# Patient Record
Sex: Male | Born: 1975 | Race: Black or African American | Hispanic: No | Marital: Married | Smoking: Never smoker
Health system: Southern US, Community
[De-identification: ages and names within clinical notes are randomized; demographics above are authoritative.]

## PROBLEM LIST (undated history)

## (undated) DIAGNOSIS — D1803 Hemangioma of intra-abdominal structures: Secondary | ICD-10-CM

## (undated) DIAGNOSIS — E119 Type 2 diabetes mellitus without complications: Secondary | ICD-10-CM

## (undated) HISTORY — DX: Hemangioma of intra-abdominal structures: D18.03

---

## 2015-10-31 ENCOUNTER — Ambulatory Visit: Payer: Self-pay | Attending: Internal Medicine

## 2015-12-29 ENCOUNTER — Ambulatory Visit: Payer: Self-pay | Admitting: Family Medicine

## 2016-06-07 ENCOUNTER — Ambulatory Visit (INDEPENDENT_AMBULATORY_CARE_PROVIDER_SITE_OTHER): Payer: BLUE CROSS/BLUE SHIELD | Admitting: Physician Assistant

## 2016-06-07 ENCOUNTER — Encounter (INDEPENDENT_AMBULATORY_CARE_PROVIDER_SITE_OTHER): Payer: Self-pay | Admitting: Physician Assistant

## 2016-06-07 VITALS — BP 151/105 | HR 61 | Temp 97.8°F | Ht 71.0 in | Wt 172.0 lb

## 2016-06-07 DIAGNOSIS — E119 Type 2 diabetes mellitus without complications: Secondary | ICD-10-CM | POA: Insufficient documentation

## 2016-06-07 DIAGNOSIS — R5383 Other fatigue: Secondary | ICD-10-CM | POA: Diagnosis not present

## 2016-06-07 DIAGNOSIS — R631 Polydipsia: Secondary | ICD-10-CM | POA: Diagnosis not present

## 2016-06-07 DIAGNOSIS — N390 Urinary tract infection, site not specified: Secondary | ICD-10-CM

## 2016-06-07 LAB — POCT URINALYSIS DIPSTICK
Bilirubin, UA: NEGATIVE
Blood, UA: NEGATIVE
Glucose, UA: 500
Leukocytes, UA: NEGATIVE
Nitrite, UA: NEGATIVE
PH UA: 6
PROTEIN UA: NEGATIVE
SPEC GRAV UA: 1.025
UROBILINOGEN UA: 0.2

## 2016-06-07 LAB — POCT GLYCOSYLATED HEMOGLOBIN (HGB A1C): HEMOGLOBIN A1C: 9.8

## 2016-06-07 MED ORDER — METFORMIN HCL ER 500 MG PO TB24: 500.0000 mg | ORAL_TABLET | Freq: Every day | ORAL | 5 refills | Status: DC

## 2016-06-07 NOTE — Patient Instructions (Signed)
Diabetes Mellitus and Food It is important for you to manage your blood sugar (glucose) level. Your blood glucose level can be greatly affected by what you eat. Eating healthier foods in the appropriate amounts throughout the day at about the same time each day will help you control your blood glucose level. It can also help slow or prevent worsening of your diabetes mellitus. Healthy eating may even help you improve the level of your blood pressure and reach or maintain a healthy weight. General recommendations for healthful eating and cooking habits include:  Eating meals and snacks regularly. Avoid going long periods of time without eating to lose weight.  Eating a diet that consists mainly of plant-based foods, such as fruits, vegetables, nuts, legumes, and whole grains.  Using low-heat cooking methods, such as baking, instead of high-heat cooking methods, such as deep frying.  Work with your dietitian to make sure you understand how to use the Nutrition Facts information on food labels. How can food affect me? Carbohydrates Carbohydrates affect your blood glucose level more than any other type of food. Your dietitian will help you determine how many carbohydrates to eat at each meal and teach you how to count carbohydrates. Counting carbohydrates is important to keep your blood glucose at a healthy level, especially if you are using insulin or taking certain medicines for diabetes mellitus. Alcohol Alcohol can cause sudden decreases in blood glucose (hypoglycemia), especially if you use insulin or take certain medicines for diabetes mellitus. Hypoglycemia can be a life-threatening condition. Symptoms of hypoglycemia (sleepiness, dizziness, and disorientation) are similar to symptoms of having too much alcohol. If your health care provider has given you approval to drink alcohol, do so in moderation and use the following guidelines:  Women should not have more than one drink per day, and men  should not have more than two drinks per day. One drink is equal to: ? 12 oz of beer. ? 5 oz of wine. ? 1 oz of hard liquor.  Do not drink on an empty stomach.  Keep yourself hydrated. Have water, diet soda, or unsweetened iced tea.  Regular soda, juice, and other mixers might contain a lot of carbohydrates and should be counted.  What foods are not recommended? As you make food choices, it is important to remember that all foods are not the same. Some foods have fewer nutrients per serving than other foods, even though they might have the same number of calories or carbohydrates. It is difficult to get your body what it needs when you eat foods with fewer nutrients. Examples of foods that you should avoid that are high in calories and carbohydrates but low in nutrients include:  Trans fats (most processed foods list trans fats on the Nutrition Facts label).  Regular soda.  Juice.  Candy.  Sweets, such as cake, pie, doughnuts, and cookies.  Fried foods.  What foods can I eat? Eat nutrient-rich foods, which will nourish your body and keep you healthy. The food you should eat also will depend on several factors, including:  The calories you need.  The medicines you take.  Your weight.  Your blood glucose level.  Your blood pressure level.  Your cholesterol level.  You should eat a variety of foods, including:  Protein. ? Lean cuts of meat. ? Proteins low in saturated fats, such as fish, egg whites, and beans. Avoid processed meats.  Fruits and vegetables. ? Fruits and vegetables that may help control blood glucose levels, such as apples,   mangoes, and yams.  Dairy products. ? Choose fat-free or low-fat dairy products, such as milk, yogurt, and cheese.  Grains, bread, pasta, and rice. ? Choose whole grain products, such as multigrain bread, whole oats, and brown rice. These foods may help control blood pressure.  Fats. ? Foods containing healthful fats, such as  nuts, avocado, olive oil, canola oil, and fish.  Does everyone with diabetes mellitus have the same meal plan? Because every person with diabetes mellitus is different, there is not one meal plan that works for everyone. It is very important that you meet with a dietitian who will help you create a meal plan that is just right for you. This information is not intended to replace advice given to you by your health care provider. Make sure you discuss any questions you have with your health care provider. Document Released: 12/13/2004 Document Revised: 08/24/2015 Document Reviewed: 02/12/2013 Elsevier Interactive Patient Education  2017 [employer] Inc[employer]  

## 2016-06-07 NOTE — Progress Notes (Signed)
Subjective:  Patient ID: Johnny Johnson, male    DOB: [birth year]  Age: 41 y.o. MRN: [id number]  CC: Concern for diabetes HPI Johnny Johnson is a 41 y.o. male with no significant past medical history presents today for concern of diabetes. He has been experiencing mild polydipsia and some fatigue. Says his whole family has diabetes. Would like to have testing done. He used to be a Stage manager in [place] but most of his time is taken up at work here in the [place]. He is also concerned for a sharp left-sided chest pain that lasts momentarily and completely subsides without pain. Pain happens sporadically and without cause. No other associated symptoms with chest pain such as radiation, diaphoresis, dizziness or shortness of breath. Denies any other symptoms   ROS Review of Systems  Constitutional: Negative for chills, fever and malaise/fatigue.  Eyes: Negative for blurred vision.  Respiratory: Negative for shortness of breath.   Cardiovascular: Positive for chest pain (See history of present illness). Negative for palpitations.  Gastrointestinal: Negative for abdominal pain and nausea.  Genitourinary: Negative for dysuria and hematuria.  Musculoskeletal: Negative for joint pain and myalgias.  Skin: Negative for rash.  Neurological: Negative for tingling and headaches.  Endo/Heme/Allergies: Positive for polydipsia (Sometimes).  Psychiatric/Behavioral: Negative for depression. The patient is not nervous/anxious.     Objective:  BP (!) 151/105 (BP Location: Left Arm, Patient Position: Sitting, Cuff Size: Normal)   Pulse 61   Temp 97.8 F (36.6 C) (Oral)   Ht 5\' 11"  (1.803 m)   Wt 172 lb (78 kg)   SpO2 100%   BMI 23.99 kg/m   BP/Weight 05/08/7822  Systolic BP 235  Diastolic BP 361  Wt. (Lbs) 172  BMI 23.99      Physical Exam  Constitutional: He is oriented to person, place, and time.  Tall, thin, NAD, polite  HENT:  Head: Normocephalic and atraumatic.   Eyes: No scleral icterus.  Neck: Normal range of motion. Neck supple. No thyromegaly present.  Cardiovascular: Normal rate, regular rhythm and normal heart sounds.   Pulmonary/Chest: Effort normal and breath sounds normal.  Abdominal: Soft. Bowel sounds are normal. There is no tenderness.  Musculoskeletal: He exhibits no edema.  Neurological: He is alert and oriented to person, place, and time.  Skin: Skin is warm and dry. No rash noted. No erythema. No pallor.  Psychiatric: He has a normal mood and affect. His behavior is normal. Thought content normal.  Vitals reviewed.    Assessment & Plan:   1. Type 2 diabetes mellitus without complication, without long-term current use of insulin (HCC) - metFORMIN (GLUCOPHAGE XR) 500 MG 24 hr tablet; Take 1 tablet (500 mg total) by mouth daily with breakfast.  Dispense: 30 tablet; Refill: 5 - Hemoglobin A1c 9.8 clinic today  2. Polydipsia - HgB A1c 9.8 - Comprehensive metabolic panel - CBC with Differential - Urinalysis Dipstick  3. Fatigue, unspecified type - HgB A1c 9.8 - Comprehensive metabolic panel - CBC with Differential - Urinalysis Dipstick   Meds ordered this encounter  Medications  . metFORMIN (GLUCOPHAGE XR) 500 MG 24 hr tablet    Sig: Take 1 tablet (500 mg total) by mouth daily with breakfast.    Dispense:  30 tablet    Refill:  5    Order Specific Question:   Supervising Provider    Answer:   Tresa Garter [[id number]]    Follow-up: Return in about 3 months (around 09/07/2016).   Lesli Albee  Altamease Oiler PA

## 2016-06-08 LAB — CBC WITH DIFFERENTIAL/PLATELET
BASOS ABS: 0 10*3/uL (ref 0.0–0.2)
BASOS: 1 %
EOS (ABSOLUTE): 0.1 10*3/uL (ref 0.0–0.4)
Eos: 2 %
HEMOGLOBIN: 15.4 g/dL (ref 13.0–17.7)
Hematocrit: 45.5 % (ref 37.5–51.0)
IMMATURE GRANS (ABS): 0 10*3/uL (ref 0.0–0.1)
Immature Granulocytes: 0 %
LYMPHS ABS: 2.3 10*3/uL (ref 0.7–3.1)
Lymphs: 52 %
MCH: 30.6 pg (ref 26.6–33.0)
MCHC: 33.8 g/dL (ref 31.5–35.7)
MCV: 90 fL (ref 79–97)
MONOCYTES: 7 %
Monocytes Absolute: 0.3 10*3/uL (ref 0.1–0.9)
NEUTROS ABS: 1.7 10*3/uL (ref 1.4–7.0)
Neutrophils: 38 %
Platelets: 276 10*3/uL (ref 150–379)
RBC: 5.04 x10E6/uL (ref 4.14–5.80)
RDW: 13.1 % (ref 12.3–15.4)
WBC: 4.4 10*3/uL (ref 3.4–10.8)

## 2016-06-08 LAB — COMPREHENSIVE METABOLIC PANEL
A/G RATIO: 1.8 (ref 1.2–2.2)
ALT: 25 IU/L (ref 0–44)
AST: 14 IU/L (ref 0–40)
Albumin: 4.5 g/dL (ref 3.5–5.5)
Alkaline Phosphatase: 159 IU/L — ABNORMAL HIGH (ref 39–117)
BILIRUBIN TOTAL: 0.4 mg/dL (ref 0.0–1.2)
BUN / CREAT RATIO: 17 (ref 9–20)
BUN: 15 mg/dL (ref 6–20)
CALCIUM: 10.1 mg/dL (ref 8.7–10.2)
CHLORIDE: 98 mmol/L (ref 96–106)
CO2: 27 mmol/L (ref 18–29)
Creatinine, Ser: 0.87 mg/dL (ref 0.76–1.27)
GFR, EST AFRICAN AMERICAN: 126 mL/min/{1.73_m2} (ref >59)
GFR, EST NON AFRICAN AMERICAN: 109 mL/min/{1.73_m2} (ref >59)
Globulin, Total: 2.5 g/dL (ref 1.5–4.5)
Glucose: 235 mg/dL — ABNORMAL HIGH (ref 65–99)
POTASSIUM: 4.3 mmol/L (ref 3.5–5.2)
Sodium: 139 mmol/L (ref 134–144)
TOTAL PROTEIN: 7 g/dL (ref 6.0–8.5)

## 2016-06-13 ENCOUNTER — Encounter (INDEPENDENT_AMBULATORY_CARE_PROVIDER_SITE_OTHER): Payer: Self-pay

## 2016-06-24 ENCOUNTER — Ambulatory Visit (INDEPENDENT_AMBULATORY_CARE_PROVIDER_SITE_OTHER): Payer: BLUE CROSS/BLUE SHIELD | Admitting: Physician Assistant

## 2016-06-24 ENCOUNTER — Encounter (INDEPENDENT_AMBULATORY_CARE_PROVIDER_SITE_OTHER): Payer: Self-pay | Admitting: Physician Assistant

## 2016-06-24 DIAGNOSIS — R748 Abnormal levels of other serum enzymes: Secondary | ICD-10-CM | POA: Diagnosis not present

## 2016-06-24 NOTE — Progress Notes (Signed)
   Subjective:  Patient ID: Johnny Johnson, male    DOB: 1975-05-30  Age: 41 y.o. MRN: [id number]  CC:  Discuss labs   HPI Johnny Johnson is a 41 y.o. male with medical hx of DM2  presents to discuss elevation of ALP in his last lab test. He is asymptomatic. Received Hep B series in his previous employment in his country. He is type O blood.   Outpatient Medications Prior to Visit  Medication Sig Dispense Refill  . metFORMIN (GLUCOPHAGE XR) 500 MG 24 hr tablet Take 1 tablet (500 mg total) by mouth daily with breakfast. 30 tablet 5   No facility-administered medications prior to visit.      ROS Review of Systems  Constitutional: Negative for chills, fever and malaise/fatigue.  Eyes: Negative for blurred vision.  Respiratory: Negative for shortness of breath.   Cardiovascular: Negative for chest pain and palpitations.  Gastrointestinal: Negative for abdominal pain and nausea.  Genitourinary: Negative for dysuria and hematuria.  Musculoskeletal: Negative for joint pain and myalgias.  Skin: Negative for rash.  Neurological: Negative for tingling and headaches.  Psychiatric/Behavioral: Negative for depression. The patient is not nervous/anxious.     Objective:  There were no vitals taken for this visit.  BP/Weight 5/0/5697  Systolic BP 948  Diastolic BP 016  Wt. (Lbs) 172  BMI 23.99      Physical Exam  Constitutional: He is oriented to person, place, and time.  Well developed, thin, NAD, polite  HENT:  Head: Normocephalic and atraumatic.  Eyes: No scleral icterus.  Neck: Normal range of motion. Neck supple. No thyromegaly present.  Cardiovascular: Normal rate, regular rhythm and normal heart sounds.   Pulmonary/Chest: Effort normal and breath sounds normal.  Abdominal: Soft. Bowel sounds are normal. There is no tenderness.  Musculoskeletal: He exhibits no edema.  Neurological: He is alert and oriented to person, place, and time. No cranial nerve deficit. Coordination  normal.  Skin: Skin is warm and dry. No rash noted. No erythema. No pallor.  Psychiatric: He has a normal mood and affect. His behavior is normal. Thought content normal.  Vitals reviewed.    Assessment & Plan:   1. Elevated serum alkaline phosphatase level - Gamma GT - Comprehensive metabolic panel - Consider transient elevation of ALP due to type O blood. - Will call with results tomorrow.  Follow-up: Return if symptoms worsen or fail to improve.   Clent Demark PA

## 2016-06-25 ENCOUNTER — Telehealth (INDEPENDENT_AMBULATORY_CARE_PROVIDER_SITE_OTHER): Payer: Self-pay | Admitting: Physician Assistant

## 2016-06-25 LAB — COMPREHENSIVE METABOLIC PANEL
A/G RATIO: 1.6 (ref 1.2–2.2)
ALK PHOS: 150 IU/L — AB (ref 39–117)
ALT: 29 IU/L (ref 0–44)
AST: 15 IU/L (ref 0–40)
Albumin: 4.7 g/dL (ref 3.5–5.5)
BUN / CREAT RATIO: 18 (ref 9–20)
BUN: 16 mg/dL (ref 6–20)
Bilirubin Total: 0.4 mg/dL (ref 0.0–1.2)
CO2: 24 mmol/L (ref 18–29)
Calcium: 9.8 mg/dL (ref 8.7–10.2)
Chloride: 101 mmol/L (ref 96–106)
Creatinine, Ser: 0.9 mg/dL (ref 0.76–1.27)
GFR calc Af Amer: 124 mL/min/{1.73_m2} (ref >59)
GFR calc non Af Amer: 107 mL/min/{1.73_m2} (ref >59)
GLOBULIN, TOTAL: 2.9 g/dL (ref 1.5–4.5)
Glucose: 200 mg/dL — ABNORMAL HIGH (ref 65–99)
POTASSIUM: 4.2 mmol/L (ref 3.5–5.2)
SODIUM: 144 mmol/L (ref 134–144)
Total Protein: 7.6 g/dL (ref 6.0–8.5)

## 2016-06-25 LAB — GAMMA GT: GGT: 45 IU/L (ref 0–65)

## 2016-06-25 NOTE — Telephone Encounter (Signed)
Patient called to request lab results Please follow up with patient

## 2016-06-26 ENCOUNTER — Telehealth (INDEPENDENT_AMBULATORY_CARE_PROVIDER_SITE_OTHER): Payer: Self-pay | Admitting: Physician Assistant

## 2016-06-26 NOTE — Telephone Encounter (Signed)
Patient returned call from PCP, please call patient at earliest convenience. Nat Christen, CMA

## 2016-06-26 NOTE — Telephone Encounter (Signed)
Will call patient when lab results are available. Nat Christen, CMA

## 2016-06-26 NOTE — Telephone Encounter (Signed)
Patient called back stated Johnny Johnson, asked him to call back.  Please follow up with patient.

## 2016-06-27 NOTE — Telephone Encounter (Signed)
I spoke to Mr. Johnny Johnson and discussed his GGT result. I advised that he return for a few more tests and to come fasting the next time he returns.

## 2016-07-01 ENCOUNTER — Ambulatory Visit (INDEPENDENT_AMBULATORY_CARE_PROVIDER_SITE_OTHER): Payer: BLUE CROSS/BLUE SHIELD | Admitting: Physician Assistant

## 2017-12-01 ENCOUNTER — Encounter (HOSPITAL_COMMUNITY): Payer: Self-pay

## 2017-12-01 ENCOUNTER — Emergency Department (HOSPITAL_COMMUNITY)
Admission: EM | Admit: 2017-12-01 | Discharge: 2017-12-01 | Disposition: A | Discharge status: Home / Self Care | Payer: BLUE CROSS/BLUE SHIELD | Attending: Emergency Medicine | Admitting: Emergency Medicine

## 2017-12-01 DIAGNOSIS — Z7984 Long term (current) use of oral hypoglycemic drugs: Secondary | ICD-10-CM | POA: Insufficient documentation

## 2017-12-01 DIAGNOSIS — Z79899 Other long term (current) drug therapy: Secondary | ICD-10-CM | POA: Insufficient documentation

## 2017-12-01 DIAGNOSIS — E119 Type 2 diabetes mellitus without complications: Secondary | ICD-10-CM | POA: Insufficient documentation

## 2017-12-01 DIAGNOSIS — R1010 Upper abdominal pain, unspecified: Secondary | ICD-10-CM | POA: Insufficient documentation

## 2017-12-01 HISTORY — DX: Type 2 diabetes mellitus without complications: E11.9

## 2017-12-01 LAB — URINALYSIS, ROUTINE W REFLEX MICROSCOPIC
BILIRUBIN URINE: NEGATIVE
Glucose, UA: NEGATIVE mg/dL
Hgb urine dipstick: NEGATIVE
Ketones, ur: 5 mg/dL — AB
Leukocytes, UA: NEGATIVE
Nitrite: NEGATIVE
PROTEIN: 30 mg/dL — AB
Specific Gravity, Urine: 1.036 — ABNORMAL HIGH (ref 1.005–1.030)
pH: 5 (ref 5.0–8.0)

## 2017-12-01 LAB — CBC
HCT: 44.6 % (ref 39.0–52.0)
Hemoglobin: 14.5 g/dL (ref 13.0–17.0)
MCH: 30 pg (ref 26.0–34.0)
MCHC: 32.5 g/dL (ref 30.0–36.0)
MCV: 92.3 fL (ref 78.0–100.0)
PLATELETS: 285 10*3/uL (ref 150–400)
RBC: 4.83 MIL/uL (ref 4.22–5.81)
RDW: 12.2 % (ref 11.5–15.5)
WBC: 4.9 10*3/uL (ref 4.0–10.5)

## 2017-12-01 LAB — COMPREHENSIVE METABOLIC PANEL
ALK PHOS: 82 U/L (ref 38–126)
ALT: 24 U/L (ref 0–44)
AST: 19 U/L (ref 15–41)
Albumin: 3.9 g/dL (ref 3.5–5.0)
Anion gap: 10 (ref 5–15)
BILIRUBIN TOTAL: 0.6 mg/dL (ref 0.3–1.2)
BUN: 14 mg/dL (ref 6–20)
CALCIUM: 9.3 mg/dL (ref 8.9–10.3)
CO2: 22 mmol/L (ref 22–32)
CREATININE: 0.99 mg/dL (ref 0.61–1.24)
Chloride: 107 mmol/L (ref 98–111)
Glucose, Bld: 141 mg/dL — ABNORMAL HIGH (ref 70–99)
Potassium: 3.7 mmol/L (ref 3.5–5.1)
Sodium: 139 mmol/L (ref 135–145)
Total Protein: 6.7 g/dL (ref 6.5–8.1)

## 2017-12-01 LAB — LIPASE, BLOOD: Lipase: 51 U/L (ref 11–51)

## 2017-12-01 LAB — CBG MONITORING, ED: GLUCOSE-CAPILLARY: 112 mg/dL — AB (ref 70–99)

## 2017-12-01 MED ORDER — SODIUM CHLORIDE 0.9 % IV BOLUS
1000.0000 mL | Freq: Once | INTRAVENOUS | Status: AC
  Administered 2017-12-01: 1000 mL via INTRAVENOUS

## 2017-12-01 MED ORDER — RANITIDINE HCL 150 MG PO TABS: 150.0000 mg | ORAL_TABLET | Freq: Two times a day (BID) | ORAL | 0 refills | Status: DC

## 2017-12-01 NOTE — ED Provider Notes (Signed)
[facility] EMERGENCY DEPARTMENT Provider Note   CSN: [id number] Arrival date & time: 12/01/17  2637     History   Chief Complaint Chief Complaint  Patient presents with  . Abdominal Pain    HPI Johnny Johnson is a 42 y.o. male with history of diabetes mellitus presents today for evaluation of acute onset, intermittent upper abdominal pain for 6 weeks.  He notes crampy burning pain to the upper abdomen which occasionally worsens after meals.  Denies any pain at this time.  Also endorses feelings of abdominal bloating and frequent belching.  He denies nausea, vomiting, fevers, chills, chest pain, or shortness of breath.  He believes he has lost around 8 pounds in the past month or so.  He also notes that he has had a long-standing history with intermittent loose stools and constipation.  He went to see his PCP who prescribed dicyclomine which he has been taking as needed without relief of his symptoms.  Denies urinary symptoms.  He is diabetic and states his blood sugars run between 90-170.  The history is provided by the patient.    Past Medical History:  Diagnosis Date  . Diabetes mellitus without complication [facility])     Patient Active Problem List   Diagnosis Date Noted  . DM (diabetes mellitus), type 2 ([place]) 06/07/2016    History reviewed. No pertinent surgical history.      Home Medications    Prior to Admission medications   Medication Sig Start Date End Date Taking? Authorizing Provider  atorvastatin (LIPITOR) 20 MG tablet Take 20 mg by mouth daily. 09/24/17  Yes [provider]  GLIPIZIDE XL 5 MG 24 hr tablet Take 5 mg by mouth daily. 09/07/17  Yes [provider]  metFORMIN (GLUCOPHAGE XR) 500 MG 24 hr tablet Take 1 tablet (500 mg total) by mouth daily with breakfast. Patient taking differently: Take 1,000 mg by mouth 2 (two) times daily.  06/07/16  Yes Clent Demark, PA-C  ranitidine (ZANTAC) 150 MG tablet Take 1 tablet  (150 mg total) by mouth 2 (two) times daily. 12/01/17   Renita Papa, PA-C    Family History No family history on file.  Social History Social History   Tobacco Use  . Smoking status: Never Smoker  . Smokeless tobacco: Never Used  Substance Use Topics  . Alcohol use: Not on file  . Drug use: Not on file     Allergies   Patient has no known allergies.   Review of Systems Review of Systems  Constitutional: Negative for chills and fever.  Respiratory: Negative for shortness of breath.   Cardiovascular: Negative for chest pain.  Gastrointestinal: Positive for abdominal pain, constipation and diarrhea. Negative for blood in stool, nausea and vomiting.  Genitourinary: Negative for dysuria, hematuria and urgency.  All other systems reviewed and are negative.    Physical Exam Updated Vital Signs BP 124/82   Pulse 75   Temp 98.1 F (36.7 C) (Oral)   Resp 16   SpO2 100%   Physical Exam  Constitutional: He appears well-developed and well-nourished. No distress.  HENT:  Head: Normocephalic and atraumatic.  Eyes: Conjunctivae are normal. Right eye exhibits no discharge. Left eye exhibits no discharge.  Neck: No JVD present. No tracheal deviation present.  Cardiovascular: Normal rate, regular rhythm and normal heart sounds. Exam reveals no gallop and no friction rub.  No murmur heard. Pulmonary/Chest: Effort normal and breath sounds normal. No stridor. No respiratory distress.  He has no wheezes. He has no rales.  Abdominal: Soft. Bowel sounds are normal. He exhibits no distension. There is no tenderness. There is no rigidity, no rebound, no guarding, no CVA tenderness, no tenderness at McBurney's point and negative Murphy's sign.  Genitourinary:  Genitourinary Comments: Deferred   Musculoskeletal: He exhibits no edema.  Lymphadenopathy:  No midline spine TTP, no paraspinal muscle tenderness, no deformity, crepitus, or step-off noted   Neurological: He is alert.  Skin:  Skin is warm and dry. No erythema.  Psychiatric: He has a normal mood and affect. His behavior is normal.  Nursing note and vitals reviewed.    ED Treatments / Results  Labs (all labs ordered are listed, but only abnormal results are displayed) Labs Reviewed  COMPREHENSIVE METABOLIC PANEL - Abnormal; Notable for the following components:      Result Value   Glucose, Bld 141 (*)    All other components within normal limits  URINALYSIS, ROUTINE W REFLEX MICROSCOPIC - Abnormal; Notable for the following components:   APPearance HAZY (*)    Specific Gravity, Urine 1.036 (*)    Ketones, ur 5 (*)    Protein, ur 30 (*)    Bacteria, UA RARE (*)    All other components within normal limits  CBG MONITORING, ED - Abnormal; Notable for the following components:   Glucose-Capillary 112 (*)    All other components within normal limits  LIPASE, BLOOD  CBC    EKG None  Radiology No results found.  Procedures Procedures (including critical care time)  Medications Ordered in ED Medications  sodium chloride 0.9 % bolus 1,000 mL (0 mLs Intravenous Stopped 12/01/17 1028)     Initial Impression / Assessment and Plan / ED Course  I have reviewed the triage vital signs and the nursing notes.  Pertinent labs & imaging results that were available during my care of the patient were reviewed by me and considered in my medical decision making (see chart for details).     Patient with intermittent upper abdominal pain for several weeks.  Went to see his PCP who prescribed him dicyclomine and has not had significant relief.  Associated symptoms include nausea, feeling bloated, belching.  Symptoms sometimes worsen after meals.  Patient is afebrile, vital signs are stable.  He is nontoxic in appearance.  Abdomen is soft, no peritoneal signs on examination.  Entirely nontender on my examination and he notes he is not currently expensing any abdominal pain.  History and physical examination suggestive  of GERD versus PUD versus gastritis.  Lab work reviewed by me shows no leukocytosis, no anemia, no metabolic derangements.  LFTs, lipase, and creatinine within normal limits.  UA does not suggest UTI or nephrolithiasis, though does suggest dehydration with increased specific gravity and mild ketonuria and proteinuria.  We will give IV fluids in the ED.  Doubt obstruction, perforation, appendicitis, colitis, dissection, AAA, or other acute surgical abdominal pathology.  Patient well-appearing, tolerating p.o. fluids in the ED without difficulty.  He is currently not experiencing any symptoms and declines any pharmacologic therapy in the ED.  Will discharge with course of Zantac, discussed dietary modifications and other lifestyle modifications that may help alleviate his symptoms.  Recommend follow-up with PCP or gastroenterology for reevaluation of symptoms.  Discussed strict ED return precautions. Pt verbalized understanding of and agreement with plan and is safe for discharge home at this time.  Final Clinical Impressions(s) / ED Diagnoses   Final diagnoses:  Pain of upper abdomen  ED Discharge Orders         Ordered    ranitidine (ZANTAC) 150 MG tablet  2 times daily     12/01/17 [address], PA-C 12/03/17 1831    Gareth Morgan, MD 12/05/17 [phone]

## 2017-12-01 NOTE — ED Triage Notes (Addendum)
Patient complains of 1 week of intermittent burning to abdomen with cramping. No nausea, no vomiting. Intermittent diarrhea. States that he thinks he is losing weight as well. Alert and oriented, NAD

## 2017-12-01 NOTE — Discharge Instructions (Signed)
1. Medications: Start taking Zantac as prescribed twice daily for the next 2 to 4 weeks. 2. Treatment: rest, drink plenty of fluids.  Eat a diet of bland foods that will not upset your stomach.  Avoid fried foods, fatty foods, acidic foods, alcohol, or smoking.  I have attached information of foods that you can find helpful.  You may also find it helpful to elevate the head of the bed. 3. Follow Up: Please followup with your primary doctor in 3-7 days for discussion of your diagnoses and further evaluation after today's visit; you can also follow-up with gastroenterology; Please return to the ER for persistent vomiting, high fevers or worsening symptoms

## 2017-12-12 ENCOUNTER — Encounter: Payer: Self-pay | Admitting: Gastroenterology

## 2017-12-16 ENCOUNTER — Other Ambulatory Visit: Payer: Self-pay | Admitting: Gastroenterology

## 2017-12-16 DIAGNOSIS — R1011 Right upper quadrant pain: Secondary | ICD-10-CM

## 2017-12-21 ENCOUNTER — Encounter (HOSPITAL_COMMUNITY): Payer: Self-pay | Admitting: Emergency Medicine

## 2017-12-21 ENCOUNTER — Other Ambulatory Visit: Payer: Self-pay

## 2017-12-21 ENCOUNTER — Emergency Department (HOSPITAL_COMMUNITY)
Admission: EM | Admit: 2017-12-21 | Discharge: 2017-12-21 | Disposition: A | Discharge status: Home / Self Care | Payer: BLUE CROSS/BLUE SHIELD | Attending: Emergency Medicine | Admitting: Emergency Medicine

## 2017-12-21 DIAGNOSIS — Z79899 Other long term (current) drug therapy: Secondary | ICD-10-CM | POA: Diagnosis not present

## 2017-12-21 DIAGNOSIS — E119 Type 2 diabetes mellitus without complications: Secondary | ICD-10-CM | POA: Diagnosis not present

## 2017-12-21 DIAGNOSIS — Z7984 Long term (current) use of oral hypoglycemic drugs: Secondary | ICD-10-CM | POA: Insufficient documentation

## 2017-12-21 DIAGNOSIS — R197 Diarrhea, unspecified: Secondary | ICD-10-CM

## 2017-12-21 LAB — COMPREHENSIVE METABOLIC PANEL
ALK PHOS: 89 U/L (ref 38–126)
ALT: 22 U/L (ref 0–44)
ANION GAP: 10 (ref 5–15)
AST: 18 U/L (ref 15–41)
Albumin: 3.9 g/dL (ref 3.5–5.0)
BUN: 11 mg/dL (ref 6–20)
CO2: 25 mmol/L (ref 22–32)
Calcium: 9.5 mg/dL (ref 8.9–10.3)
Chloride: 103 mmol/L (ref 98–111)
Creatinine, Ser: 1.04 mg/dL (ref 0.61–1.24)
Glucose, Bld: 190 mg/dL — ABNORMAL HIGH (ref 70–99)
Potassium: 3.9 mmol/L (ref 3.5–5.1)
SODIUM: 138 mmol/L (ref 135–145)
Total Bilirubin: 0.6 mg/dL (ref 0.3–1.2)
Total Protein: 6.7 g/dL (ref 6.5–8.1)

## 2017-12-21 LAB — URINALYSIS, ROUTINE W REFLEX MICROSCOPIC
Bilirubin Urine: NEGATIVE
Glucose, UA: NEGATIVE mg/dL
Hgb urine dipstick: NEGATIVE
Ketones, ur: NEGATIVE mg/dL
LEUKOCYTES UA: NEGATIVE
NITRITE: NEGATIVE
PROTEIN: NEGATIVE mg/dL
SPECIFIC GRAVITY, URINE: 1.02 (ref 1.005–1.030)
pH: 5 (ref 5.0–8.0)

## 2017-12-21 LAB — CBC
HCT: 44.3 % (ref 39.0–52.0)
HEMOGLOBIN: 14.8 g/dL (ref 13.0–17.0)
MCH: 30.1 pg (ref 26.0–34.0)
MCHC: 33.4 g/dL (ref 30.0–36.0)
MCV: 90 fL (ref 78.0–100.0)
PLATELETS: 336 10*3/uL (ref 150–400)
RBC: 4.92 MIL/uL (ref 4.22–5.81)
RDW: 12.1 % (ref 11.5–15.5)
WBC: 4.5 10*3/uL (ref 4.0–10.5)

## 2017-12-21 LAB — LIPASE, BLOOD: LIPASE: 40 U/L (ref 11–51)

## 2017-12-21 LAB — POC OCCULT BLOOD, ED: FECAL OCCULT BLD: NEGATIVE

## 2017-12-21 MED ORDER — SUCRALFATE 1 G PO TABS
1.0000 g | ORAL_TABLET | Freq: Once | ORAL | Status: AC
  Administered 2017-12-21: 1 g via ORAL
  Filled 2017-12-21: qty 1

## 2017-12-21 MED ORDER — GI COCKTAIL ~~LOC~~
30.0000 mL | Freq: Once | ORAL | Status: AC
  Administered 2017-12-21: 30 mL via ORAL
  Filled 2017-12-21: qty 30

## 2017-12-21 MED ORDER — FAMOTIDINE 20 MG PO TABS
20.0000 mg | ORAL_TABLET | Freq: Once | ORAL | Status: AC
  Administered 2017-12-21: 20 mg via ORAL
  Filled 2017-12-21: qty 1

## 2017-12-21 NOTE — Discharge Instructions (Signed)
You will need to follow-up with your regular doctor and your gastroenterologist in regards to your symptoms.  Please return to the ER sooner if you have any new or worsening symptoms, or if you have any of the following symptoms:  Abdominal pain that does not go away.  You have a fever.  You keep throwing up (vomiting).  The pain is felt only in portions of the abdomen. Pain in the right side could possibly be appendicitis. In an adult, pain in the left lower portion of the abdomen could be colitis or diverticulitis.  You pass bloody or black tarry stools.  There is bright red blood in the stool.  The constipation stays for more than 4 days.  There is belly (abdominal) or rectal pain.  You do not seem to be getting better.  You have any questions or concerns.

## 2017-12-21 NOTE — ED Triage Notes (Signed)
Pt states he was seen in ED 3 weeks ago for intermittent burning and cramping to abd.  States pain has improved some but not completely relieved.  Also reports black diarrhea.  Denies nausea and vomiting.

## 2017-12-21 NOTE — ED Notes (Signed)
ED Provider at bedside. 

## 2017-12-21 NOTE — ED Provider Notes (Signed)
[facility] [place] EMERGENCY DEPARTMENT Provider Note   CSN: [id number] Arrival date & time: 12/21/17  1743     History   Chief Complaint Chief Complaint  Patient presents with  . Abdominal Pain    HPI Johnny Johnson is a 42 y.o. male.  HPI   Pt is a 42 year old male with history of T2DM who presents emergency department today complaining of persistent abdominal pain and black diarrhea.  States he has had the abdominal pain for several weeks and he was seen in the ED recently for this.  He was prescribed dicyclomine for his symptoms and since then it has improved somewhat.  Pain is intermittent and occurs about 3 times per day.  Rates pain as mild and 3/10.  It does not radiate.  No nausea or vomiting.  He presented today to the ED due to concern for diarrhea for the last 2 days.  He states that he has had black diarrhea.  No lightheadedness, dizziness, shortness of breath or chest pain.  No urinary symptoms.  No fevers or chills.  States he has been taking Maalox as well.  Past Medical History:  Diagnosis Date  . Diabetes mellitus without complication [facility])     Patient Active Problem List   Diagnosis Date Noted  . DM (diabetes mellitus), type 2 ([place]) 06/07/2016    History reviewed. No pertinent surgical history.      Home Medications    Prior to Admission medications   Medication Sig Start Date End Date Taking? Authorizing Provider  atorvastatin (LIPITOR) 20 MG tablet Take 20 mg by mouth daily. 09/24/17   [provider]  GLIPIZIDE XL 5 MG 24 hr tablet Take 5 mg by mouth daily. 09/07/17   [provider]  metFORMIN (GLUCOPHAGE XR) 500 MG 24 hr tablet Take 1 tablet (500 mg total) by mouth daily with breakfast. Patient taking differently: Take 1,000 mg by mouth 2 (two) times daily.  06/07/16   Clent Demark, PA-C  ranitidine (ZANTAC) 150 MG tablet Take 1 tablet (150 mg total) by mouth 2 (two) times daily. 12/01/17   Renita Papa, PA-C     Family History No family history on file.  Social History Social History   Tobacco Use  . Smoking status: Never Smoker  . Smokeless tobacco: Never Used  Substance Use Topics  . Alcohol use: Not Currently  . Drug use: Not Currently     Allergies   Patient has no known allergies.   Review of Systems Review of Systems  Constitutional: Negative for chills and fever.  HENT: Negative for congestion and rhinorrhea.   Eyes: Negative for visual disturbance.  Respiratory: Negative for cough and shortness of breath.   Cardiovascular: Negative for chest pain.  Gastrointestinal: Positive for abdominal pain and diarrhea. Negative for constipation, nausea and vomiting.       Black stool  Endocrine: Negative for polyuria.  Genitourinary: Negative for dysuria, frequency and urgency.  Skin: Negative for color change.  Neurological: Negative for headaches.   Physical Exam Updated Vital Signs BP (!) 125/96   Pulse 76   Temp 98.4 F (36.9 C) (Oral)   SpO2 100%   Physical Exam  Constitutional: He appears well-developed and well-nourished.  Non-toxic appearance. He does not appear ill. No distress.  HENT:  Head: Normocephalic and atraumatic.  Mouth/Throat: Oropharynx is clear and moist.  Eyes: Conjunctivae are normal. No scleral icterus.  Neck: Neck supple.  Cardiovascular: Normal rate, regular rhythm, normal heart  sounds and intact distal pulses.  No murmur heard. Pulmonary/Chest: Effort normal and breath sounds normal. No respiratory distress. He has no wheezes. He has no rales.  Abdominal: Soft. Bowel sounds are normal. There is no tenderness. There is no rigidity, no rebound, no guarding and no CVA tenderness.  Genitourinary:  Genitourinary Comments: Digital rectal exam completed with chaperone present in the room.  Patient has normal rectal tone.  No gross blood on exam.  No significant stool in the vault.  Musculoskeletal: He exhibits no edema.  Neurological: He is alert.    Skin: Skin is warm and dry.  Psychiatric: He has a normal mood and affect.  Nursing note and vitals reviewed.  ED Treatments / Results  Labs (all labs ordered are listed, but only abnormal results are displayed) Labs Reviewed  COMPREHENSIVE METABOLIC PANEL - Abnormal; Notable for the following components:      Result Value   Glucose, Bld 190 (*)    All other components within normal limits  LIPASE, BLOOD  CBC  URINALYSIS, ROUTINE W REFLEX MICROSCOPIC  POC OCCULT BLOOD, ED    EKG None  Radiology No results found.  Procedures Procedures (including critical care time)  Medications Ordered in ED Medications  gi cocktail (Maalox,Lidocaine,Donnatal) (30 mLs Oral Given 12/21/17 1846)  sucralfate (CARAFATE) tablet 1 g (1 g Oral Given 12/21/17 1845)  famotidine (PEPCID) tablet 20 mg (20 mg Oral Given 12/21/17 1845)     Initial Impression / Assessment and Plan / ED Course  I have reviewed the triage vital signs and the nursing notes.  Pertinent labs & imaging results that were available during my care of the patient were reviewed by me and considered in my medical decision making (see chart for details).     Final Clinical Impressions(s) / ED Diagnoses   Final diagnoses:  Diarrhea, unspecified type   Pt presenting with concerns of black diarrhea for 2 days. No lightheadedness dizziness or bright red blood per rectum.  Complains of abdominal pain that have been present for the last several weeks.  Symptoms of abdominal pain have improved since onset after being prescribed by dicyclomine.  He is currently being followed by GI and his PCP for his symptoms and is scheduled for outpatient imaging studies next week.  His vitals are stable in the ED today and his exam is benign.  He has no abdominal tenderness, rebound guarding or rigidity.  No CVA tenderness bilaterally.  Hemoccult was negative for blood.  Labs are reassuring without leukocytosis or anemia.  Normal electrolytes, kidney  and liver function.  Lipase negative.  UA negative.  Unclear etiology of patient's symptoms however lower suspicion for infectious diarrhea given no fevers or bloody stools and no recent history to support this.  Dark color of stool may be secondary to use of Maalox and diarrhea may be secondary to metformin use. his exam today is benign and he is nontoxic nonseptic appearing and feel that he has low risk for an acute intra-abdominal pathology at this time.  Feel he is safe to follow-up as an outpatient with his GI and PCP.  Advised him to call the office tomorrow to schedule appointment and to return to the ER for any new or worsening symptoms in the meantime.  Patient voiced understanding of plan reasons to return to the ED.  All questions answered.  ED Discharge Orders    None       Bishop Dublin 12/21/17 2149    Gerlene Fee  M, MD 12/21/17 2314

## 2017-12-21 NOTE — ED Notes (Signed)
Occult card at bedside 

## 2017-12-26 ENCOUNTER — Ambulatory Visit (HOSPITAL_COMMUNITY)
Admission: RE | Admit: 2017-12-26 | Discharge: 2017-12-26 | Disposition: A | Discharge status: Home / Self Care | Payer: BLUE CROSS/BLUE SHIELD | Source: Ambulatory Visit | Attending: Gastroenterology | Admitting: Gastroenterology

## 2017-12-26 DIAGNOSIS — R1011 Right upper quadrant pain: Secondary | ICD-10-CM | POA: Diagnosis present

## 2017-12-26 DIAGNOSIS — R160 Hepatomegaly, not elsewhere classified: Secondary | ICD-10-CM | POA: Diagnosis not present

## 2017-12-26 DIAGNOSIS — K760 Fatty (change of) liver, not elsewhere classified: Secondary | ICD-10-CM | POA: Insufficient documentation

## 2017-12-26 MED ORDER — TECHNETIUM TC 99M MEBROFENIN IV KIT
5.4000 | PACK | Freq: Once | INTRAVENOUS | Status: AC
  Administered 2017-12-26: 5.4 via INTRAVENOUS

## 2018-01-01 ENCOUNTER — Other Ambulatory Visit: Payer: Self-pay | Admitting: Gastroenterology

## 2018-01-01 DIAGNOSIS — R933 Abnormal findings on diagnostic imaging of other parts of digestive tract: Secondary | ICD-10-CM

## 2018-01-05 ENCOUNTER — Ambulatory Visit (HOSPITAL_COMMUNITY)
Admission: RE | Admit: 2018-01-05 | Discharge: 2018-01-05 | Disposition: A | Discharge status: Home / Self Care | Payer: BLUE CROSS/BLUE SHIELD | Source: Ambulatory Visit | Attending: Gastroenterology | Admitting: Gastroenterology

## 2018-01-05 DIAGNOSIS — D1803 Hemangioma of intra-abdominal structures: Secondary | ICD-10-CM | POA: Insufficient documentation

## 2018-01-05 DIAGNOSIS — R933 Abnormal findings on diagnostic imaging of other parts of digestive tract: Secondary | ICD-10-CM | POA: Diagnosis present

## 2018-01-05 MED ORDER — GADOBUTROL 1 MMOL/ML IV SOLN
7.5000 mL | Freq: Once | INTRAVENOUS | Status: AC | PRN
  Administered 2018-01-05: 7.5 mL via INTRAVENOUS

## 2018-01-20 ENCOUNTER — Ambulatory Visit: Payer: BLUE CROSS/BLUE SHIELD | Admitting: Gastroenterology

## 2018-02-17 ENCOUNTER — Ambulatory Visit: Payer: BLUE CROSS/BLUE SHIELD | Admitting: Gastroenterology

## 2018-04-30 ENCOUNTER — Other Ambulatory Visit: Payer: Self-pay

## 2018-04-30 ENCOUNTER — Encounter (INDEPENDENT_AMBULATORY_CARE_PROVIDER_SITE_OTHER): Payer: Self-pay | Admitting: Family Medicine

## 2018-04-30 ENCOUNTER — Ambulatory Visit (INDEPENDENT_AMBULATORY_CARE_PROVIDER_SITE_OTHER): Payer: BLUE CROSS/BLUE SHIELD | Admitting: Family Medicine

## 2018-04-30 VITALS — BP 117/81 | HR 81 | Temp 98.2°F | Ht 71.0 in | Wt 149.0 lb

## 2018-04-30 DIAGNOSIS — R21 Rash and other nonspecific skin eruption: Secondary | ICD-10-CM

## 2018-04-30 DIAGNOSIS — E119 Type 2 diabetes mellitus without complications: Secondary | ICD-10-CM | POA: Diagnosis not present

## 2018-04-30 LAB — POCT GLYCOSYLATED HEMOGLOBIN (HGB A1C): HEMOGLOBIN A1C: 5.6 % (ref 4.0–5.6)

## 2018-04-30 MED ORDER — TRIAMCINOLONE ACETONIDE 0.1 % EX CREA: 1.0000 "application " | TOPICAL_CREAM | Freq: Two times a day (BID) | CUTANEOUS | 1 refills | Status: AC

## 2018-04-30 MED ORDER — ATORVASTATIN CALCIUM 20 MG PO TABS: 20.0000 mg | ORAL_TABLET | Freq: Every day | ORAL | 6 refills | Status: DC

## 2018-04-30 MED ORDER — GLIPIZIDE 5 MG PO TABS: 2.5000 mg | ORAL_TABLET | Freq: Two times a day (BID) | ORAL | 6 refills | Status: AC

## 2018-04-30 NOTE — Patient Instructions (Signed)
Please obtain Hydrocortisone cream over-the-counter for rash on your face.

## 2018-04-30 NOTE — Progress Notes (Signed)
Subjective:  Patient ID: Johnny Johnson, male    DOB: 05-26-1975  Age: 43 y.o. MRN: [id number]  CC: Abdominal Pain   HPI Johnny Johnson is a 43 year old male with a history of type 2 diabetes mellitus (A1c 5.6) who presents today for a follow-up of his diabetes mellitus. He is up-to-date on annual eye exam which he had 1 month ago at [facility] eye care. He was previously on metformin but discontinued it due to abdominal pain and other GI side effects which have resolved ever since he discontinued metformin.  Remains on glipizide at this time.  He is concerned about a chronic rash on his forehead and back which he has had for over 6 months and rash is pruritic.  Denies allergies to foods or creams and denies introduction of new detergents or body products.  Past Medical History:  Diagnosis Date  . Diabetes mellitus without complication ([place])   . Hepatic hemangioma     History reviewed. No pertinent surgical history.  No Known Allergies   Outpatient Medications Prior to Visit  Medication Sig Dispense Refill  . atorvastatin (LIPITOR) 20 MG tablet Take 20 mg by mouth daily.  1  . GLIPIZIDE XL 5 MG 24 hr tablet Take 5 mg by mouth daily.  0  . metFORMIN (GLUCOPHAGE XR) 500 MG 24 hr tablet Take 1 tablet (500 mg total) by mouth daily with breakfast. (Patient not taking: Reported on 04/30/2018) 30 tablet 5  . ranitidine (ZANTAC) 150 MG tablet Take 1 tablet (150 mg total) by mouth 2 (two) times daily. 60 tablet 0   No facility-administered medications prior to visit.     ROS Review of Systems  Constitutional: Negative for activity change and appetite change.  HENT: Negative for sinus pressure and sore throat.   Eyes: Negative for visual disturbance.  Respiratory: Negative for cough, chest tightness and shortness of breath.   Cardiovascular: Negative for chest pain and leg swelling.  Gastrointestinal: Negative for abdominal distention, abdominal pain, constipation and  diarrhea.  Endocrine: Negative.   Genitourinary: Negative for dysuria.  Musculoskeletal: Negative for joint swelling and myalgias.  Skin: Negative for rash.  Allergic/Immunologic: Negative.   Neurological: Negative for weakness, light-headedness and numbness.  Psychiatric/Behavioral: Negative for dysphoric mood and suicidal ideas.    Objective:  BP 117/81 (BP Location: Right Arm, Patient Position: Sitting, Cuff Size: Normal)   Pulse 81   Temp 98.2 F (36.8 C) (Oral)   Ht _0  (1.803 m)   Wt 149 lb (67.6 kg)   SpO2 100%   BMI 20.78 kg/m   BP/Weight 04/30/2018 09/12/4313 4/0/0867  Systolic BP 619 509 326  Diastolic BP 81 96 82  Wt. (Lbs) 149 - -  BMI 20.78 - -      Physical Exam Constitutional:      Appearance: He is well-developed.  Cardiovascular:     Rate and Rhythm: Normal rate.     Heart sounds: Normal heart sounds. No murmur.  Pulmonary:     Effort: Pulmonary effort is normal.     Breath sounds: Normal breath sounds. No wheezing or rales.  Chest:     Chest wall: No tenderness.  Abdominal:     General: Bowel sounds are normal. There is no distension.     Palpations: Abdomen is soft. There is no mass.     Tenderness: There is no abdominal tenderness.  Musculoskeletal: Normal range of motion.  Skin:    Comments: Coarse rash on forehead Scaly  rash covering superior aspect of back  Neurological:     Mental Status: He is alert and oriented to person, place, and time.     Lab Results  Component Value Date   HGBA1C 5.6 04/30/2018    Assessment & Plan:   1. Type 2 diabetes mellitus without complication, without long-term current use of insulin (HCC) Controlled with A1c of 5.6 Continue current regimen Unable to tolerate metformin due to GI side effects - HgB A1c - atorvastatin (LIPITOR) 20 MG tablet; Take 1 tablet (20 mg total) by mouth daily.  Dispense: 30 tablet; Refill: 6 - glipiZIDE (GLUCOTROL) 5 MG tablet; Take 0.5 tablets (2.5 mg total) by mouth 2  (two) times daily before a meal.  Dispense: 60 tablet; Refill: 6 - Microalbumin/Creatinine Ratio, Urine - CMP14+EGFR - Lipid panel  2. Rash and nonspecific skin eruption Placed on triamcinolone for backslash as rash is eczematous Advised to use hydrocortisone for forehead rash   Meds ordered this encounter  Medications  . atorvastatin (LIPITOR) 20 MG tablet    Sig: Take 1 tablet (20 mg total) by mouth daily.    Dispense:  30 tablet    Refill:  6  . glipiZIDE (GLUCOTROL) 5 MG tablet    Sig: Take 0.5 tablets (2.5 mg total) by mouth 2 (two) times daily before a meal.    Dispense:  60 tablet    Refill:  6  . triamcinolone cream (KENALOG) 0.1 %    Sig: Apply 1 application topically 2 (two) times daily. To rash on back    Dispense:  45 g    Refill:  1    Follow-up: Return in about 3 months (around 07/30/2018) for follow up of chronic medical conditions.   Charlott Rakes MD

## 2018-05-01 LAB — CMP14+EGFR
ALT: 26 IU/L (ref 0–44)
AST: 23 IU/L (ref 0–40)
Albumin/Globulin Ratio: 1.9 (ref 1.2–2.2)
Albumin: 4.2 g/dL (ref 4.0–5.0)
Alkaline Phosphatase: 113 IU/L (ref 39–117)
BUN/Creatinine Ratio: 17 (ref 9–20)
BUN: 12 mg/dL (ref 6–24)
Bilirubin Total: <0.2 mg/dL (ref 0.0–1.2)
CO2: 27 mmol/L (ref 20–29)
Calcium: 9.5 mg/dL (ref 8.7–10.2)
Chloride: 102 mmol/L (ref 96–106)
Creatinine, Ser: 0.7 mg/dL — ABNORMAL LOW (ref 0.76–1.27)
GFR calc Af Amer: 136 mL/min/{1.73_m2} (ref >59)
GFR calc non Af Amer: 117 mL/min/{1.73_m2} (ref >59)
Globulin, Total: 2.2 g/dL (ref 1.5–4.5)
Glucose: 74 mg/dL (ref 65–99)
Potassium: 4.8 mmol/L (ref 3.5–5.2)
Sodium: 143 mmol/L (ref 134–144)
Total Protein: 6.4 g/dL (ref 6.0–8.5)

## 2018-05-01 LAB — MICROALBUMIN / CREATININE URINE RATIO
Creatinine, Urine: 56.5 mg/dL
Microalb/Creat Ratio: <5 mg/g creat (ref 0–29)

## 2018-05-01 LAB — LIPID PANEL
CHOL/HDL RATIO: 4.5 ratio (ref 0.0–5.0)
Cholesterol, Total: 131 mg/dL (ref 100–199)
HDL: 29 mg/dL — ABNORMAL LOW (ref >39)
LDL CALC: 42 mg/dL (ref 0–99)
Triglycerides: 302 mg/dL — ABNORMAL HIGH (ref 0–149)
VLDL Cholesterol Cal: 60 mg/dL — ABNORMAL HIGH (ref 5–40)

## 2018-05-05 ENCOUNTER — Telehealth (INDEPENDENT_AMBULATORY_CARE_PROVIDER_SITE_OTHER): Payer: Self-pay

## 2018-05-05 NOTE — Telephone Encounter (Signed)
Patient returned call to clinic and was notified that his liver and kidney are normal. Total cholesterol is normal but triglycerides are elevated. Advised patient to continue with his cholesterol panel and obtain omega 3 fish oil capsules OTC. Continue with a low cholesterol diet and lifestyle modifications. Nat Christen, CMA

## 2018-05-05 NOTE — Telephone Encounter (Signed)
-----   Message from Charlott Rakes, MD sent at 05/01/2018  9:10 AM EST ----- Kidney and liver function are normal, total cholesterol is normal but triglycerides which is a type of cholesterol is elevated.  Please advise him to continue with his cholesterol panel and obtain omega-3 fish oil capsules over-the-counter as this will be beneficial.  Continue with a low-cholesterol diet and lifestyle modifications.

## 2018-07-29 ENCOUNTER — Telehealth: Payer: Self-pay | Admitting: *Deleted

## 2018-07-29 NOTE — Telephone Encounter (Signed)
Medical Assistant left message on patient's home and cell voicemail. Voicemail states to give a call back to [place] with [facility] at [phone]. Patient is aware of visit being a tele visit via voicemail.

## 2018-07-30 ENCOUNTER — Other Ambulatory Visit: Payer: Self-pay

## 2018-07-30 ENCOUNTER — Ambulatory Visit: Payer: BLUE CROSS/BLUE SHIELD | Attending: Primary Care | Admitting: Primary Care

## 2018-07-30 ENCOUNTER — Ambulatory Visit (INDEPENDENT_AMBULATORY_CARE_PROVIDER_SITE_OTHER): Payer: BLUE CROSS/BLUE SHIELD | Admitting: Primary Care

## 2018-07-30 NOTE — Progress Notes (Signed)
Medical Assistant left message on patient's home and cell voicemail. Voicemail states to give a call back to [place] with [facility] at [phone] @9 :28am.

## 2018-10-26 ENCOUNTER — Other Ambulatory Visit: Payer: Self-pay

## 2018-10-26 ENCOUNTER — Emergency Department (HOSPITAL_COMMUNITY)
Admission: EM | Admit: 2018-10-26 | Discharge: 2018-10-26 | Disposition: A | Discharge status: Home / Self Care | Payer: BLUE CROSS/BLUE SHIELD | Attending: Emergency Medicine | Admitting: Emergency Medicine

## 2018-10-26 ENCOUNTER — Emergency Department (HOSPITAL_COMMUNITY): Payer: BLUE CROSS/BLUE SHIELD

## 2018-10-26 ENCOUNTER — Encounter (HOSPITAL_COMMUNITY): Payer: Self-pay | Admitting: Emergency Medicine

## 2018-10-26 DIAGNOSIS — Z7984 Long term (current) use of oral hypoglycemic drugs: Secondary | ICD-10-CM | POA: Diagnosis not present

## 2018-10-26 DIAGNOSIS — Z79899 Other long term (current) drug therapy: Secondary | ICD-10-CM | POA: Insufficient documentation

## 2018-10-26 DIAGNOSIS — E119 Type 2 diabetes mellitus without complications: Secondary | ICD-10-CM | POA: Diagnosis not present

## 2018-10-26 DIAGNOSIS — R0789 Other chest pain: Secondary | ICD-10-CM | POA: Diagnosis not present

## 2018-10-26 DIAGNOSIS — R079 Chest pain, unspecified: Secondary | ICD-10-CM | POA: Diagnosis present

## 2018-10-26 LAB — CBC WITH DIFFERENTIAL/PLATELET
Abs Immature Granulocytes: 0 10*3/uL (ref 0.00–0.07)
Basophils Absolute: 0 10*3/uL (ref 0.0–0.1)
Basophils Relative: 1 %
Eosinophils Absolute: 0 10*3/uL (ref 0.0–0.5)
Eosinophils Relative: 1 %
HCT: 43.4 % (ref 39.0–52.0)
Hemoglobin: 15 g/dL (ref 13.0–17.0)
Immature Granulocytes: 0 %
Lymphocytes Relative: 46 %
Lymphs Abs: 1.9 10*3/uL (ref 0.7–4.0)
MCH: 30.6 pg (ref 26.0–34.0)
MCHC: 34.6 g/dL (ref 30.0–36.0)
MCV: 88.6 fL (ref 80.0–100.0)
Monocytes Absolute: 0.4 10*3/uL (ref 0.1–1.0)
Monocytes Relative: 10 %
Neutro Abs: 1.7 10*3/uL (ref 1.7–7.7)
Neutrophils Relative %: 42 %
Platelets: 216 10*3/uL (ref 150–400)
RBC: 4.9 MIL/uL (ref 4.22–5.81)
RDW: 11.9 % (ref 11.5–15.5)
WBC: 4.1 10*3/uL (ref 4.0–10.5)
nRBC: 0 % (ref 0.0–0.2)

## 2018-10-26 LAB — COMPREHENSIVE METABOLIC PANEL
ALT: 34 U/L (ref 0–44)
AST: 21 U/L (ref 15–41)
Albumin: 4 g/dL (ref 3.5–5.0)
Alkaline Phosphatase: 135 U/L — ABNORMAL HIGH (ref 38–126)
Anion gap: 10 (ref 5–15)
BUN: 14 mg/dL (ref 6–20)
CO2: 23 mmol/L (ref 22–32)
Calcium: 9.2 mg/dL (ref 8.9–10.3)
Chloride: 104 mmol/L (ref 98–111)
Creatinine, Ser: 0.85 mg/dL (ref 0.61–1.24)
GFR calc Af Amer: >60 mL/min (ref >60)
GFR calc non Af Amer: >60 mL/min (ref >60)
Glucose, Bld: 207 mg/dL — ABNORMAL HIGH (ref 70–99)
Potassium: 4.4 mmol/L (ref 3.5–5.1)
Sodium: 137 mmol/L (ref 135–145)
Total Bilirubin: 0.9 mg/dL (ref 0.3–1.2)
Total Protein: 6.4 g/dL — ABNORMAL LOW (ref 6.5–8.1)

## 2018-10-26 LAB — TROPONIN I (HIGH SENSITIVITY): Troponin I (High Sensitivity): <2 ng/L (ref <18)

## 2018-10-26 LAB — D-DIMER, QUANTITATIVE: D-Dimer, Quant: <0.27 ug/mL-FEU (ref 0.00–0.50)

## 2018-10-26 MED ORDER — OMEPRAZOLE 20 MG PO CPDR: 20.0000 mg | DELAYED_RELEASE_CAPSULE | Freq: Every day | ORAL | 0 refills | Status: AC

## 2018-10-26 NOTE — ED Notes (Signed)
Pt returned from radiology at this time.  

## 2018-10-26 NOTE — ED Triage Notes (Signed)
Pt. Stated,chest pain started yesterday.  Dr.sent me here

## 2018-10-26 NOTE — ED Notes (Signed)
Patient transported to X-ray 

## 2018-10-26 NOTE — ED Provider Notes (Signed)
[place] EMERGENCY DEPARTMENT Provider Note   CSN: [id number] Arrival date & time: 10/26/18  1029    History   Chief Complaint Chief Complaint  Patient presents with  . Chest Pain    HPI Johnny Johnson is a 43 y.o. male.     The history is provided by the patient. No language interpreter was used.  Chest Pain  Johnny Johnson is a 43 y.o. male who presents to the Emergency Department complaining of chest pain.  He presents to the ED complaining of central chest pain.  Pain began yesterday and is constant in nature.  It occasionally radiates to his left shoulder.  He has associated cough x 1 week.  Denies fevers, sob, abdominal pain, N/V/D, leg swelling pain.  He saw his PCP today, who referred him to the ED for further evaluation.  He started lisinopril and atorvastatin two weeks ago.  Denies alcohol, street dugs.  He has a family hx/o CAD in father (greater than 35yo).  He smokes tobacco.    Overall sxs are improving.   Past Medical History:  Diagnosis Date  . Diabetes mellitus without complication ([place])   . Hepatic hemangioma     Patient Active Problem List   Diagnosis Date Noted  . DM (diabetes mellitus), type 2 ([place]) 06/07/2016    History reviewed. No pertinent surgical history.      Home Medications    Prior to Admission medications   Medication Sig Start Date End Date Taking? Authorizing Provider  atorvastatin (LIPITOR) 20 MG tablet Take 1 tablet (20 mg total) by mouth daily. 04/30/18   Charlott Rakes, MD  glipiZIDE (GLUCOTROL) 5 MG tablet Take 0.5 tablets (2.5 mg total) by mouth 2 (two) times daily before a meal. 04/30/18   Charlott Rakes, MD  omeprazole (PRILOSEC) 20 MG capsule Take 1 capsule (20 mg total) by mouth daily. 10/26/18   Quintella Reichert, MD  triamcinolone cream (KENALOG) 0.1 % Apply 1 application topically 2 (two) times daily. To rash on back 04/30/18   Charlott Rakes, MD    Family History No family history  on file.  Social History Social History   Tobacco Use  . Smoking status: Never Smoker  . Smokeless tobacco: Never Used  Substance Use Topics  . Alcohol use: Not Currently  . Drug use: Not Currently     Allergies   Patient has no known allergies.   Review of Systems Review of Systems  Cardiovascular: Positive for chest pain.  All other systems reviewed and are negative.    Physical Exam Updated Vital Signs BP (!) 127/92   Pulse 79   Temp 98.5 F (36.9 C)   Resp 18   SpO2 100%   Physical Exam Vitals signs and nursing note reviewed.  Constitutional:      Appearance: He is well-developed.  HENT:     Head: Normocephalic and atraumatic.  Cardiovascular:     Rate and Rhythm: Normal rate and regular rhythm.     Heart sounds: No murmur.  Pulmonary:     Effort: Pulmonary effort is normal. No respiratory distress.     Breath sounds: Normal breath sounds.  Chest:     Chest wall: No tenderness.  Abdominal:     Palpations: Abdomen is soft.     Tenderness: There is no abdominal tenderness. There is no guarding or rebound.  Musculoskeletal:        General: No swelling or tenderness.  Skin:    General:  Skin is warm and dry.  Neurological:     Mental Status: He is alert and oriented to person, place, and time.  Psychiatric:        Mood and Affect: Mood normal.        Behavior: Behavior normal.      ED Treatments / Results  Labs (all labs ordered are listed, but only abnormal results are displayed) Labs Reviewed  COMPREHENSIVE METABOLIC PANEL - Abnormal; Notable for the following components:      Result Value   Glucose, Bld 207 (*)    Total Protein 6.4 (*)    Alkaline Phosphatase 135 (*)    All other components within normal limits  CBC WITH DIFFERENTIAL/PLATELET  D-DIMER, QUANTITATIVE (NOT AT [facility])  TROPONIN I (HIGH SENSITIVITY)  TROPONIN I (HIGH SENSITIVITY)    EKG EKG Interpretation  Date/Time:  Monday October 26 2018 11:47:00 EDT Ventricular Rate:  76  PR Interval:  124 QRS Duration: 79 QT Interval:  346 QTC Calculation: 389 R Axis:   73 Text Interpretation:  Sinus rhythm Abnormal R-wave progression, early transition ST elevation, c/w early repolarization Confirmed by Quintella Reichert [phone]) on 10/26/2018 11:54:06 AM   Radiology Dg Chest 2 View  Result Date: 10/26/2018 CLINICAL DATA:  Chest pain and dry cough since 10/25/2018. EXAM: CHEST - 2 VIEW COMPARISON:  None. FINDINGS: Lungs clear. Heart size normal. No pneumothorax or pleural fluid. No acute or focal bony abnormality. IMPRESSION: Negative chest. Electronically Signed   By: Inge Rise M.D.   On: 10/26/2018 12:07    Procedures Procedures (including critical care time)  Medications Ordered in ED Medications - No data to display   Initial Impression / Assessment and Plan / ED Course  I have reviewed the triage vital signs and the nursing notes.  Pertinent labs & imaging results that were available during my care of the patient were reviewed by me and considered in my medical decision making (see chart for details).        Patient here for evaluation of greater than 24 hours of central chest pain. He does have a history of diabetes. EKG shows ST elevation consistent with early repolarization, no priors available for comparison. Troponin is negative. Presentation is not consistent with PE, negative D dimer. Presentation is not consistent with ACS, dissection. Discussed with patient findings of studies. Discussed unclear source of chest pain, will try empiric treatment for possible reflux. Counseled patient on home care, outpatient follow-up and return precautions.  Offered patient COVID-19 testing and he declines, current presentation is not consistent with COVID-19 infection. Final Clinical Impressions(s) / ED Diagnoses   Final diagnoses:  Atypical chest pain    ED Discharge Orders         Ordered    omeprazole (PRILOSEC) 20 MG capsule  Daily     10/26/18 1329            Quintella Reichert, MD 10/26/18 1330

## 2018-10-26 NOTE — ED Notes (Signed)
Patient verbalizes understanding of discharge instructions. Opportunity for questioning and answers were provided. Armband removed by staff, pt discharged from ED.  

## 2018-10-26 NOTE — ED Triage Notes (Signed)
Pt.denies any other symptoms

## 2020-12-20 IMAGING — DX CHEST - 2 VIEW
2 series · 2 of 2 positions shown · non-contrast
Comparison: None.

CLINICAL DATA: Chest pain and dry cough since 10/25/2018.

EXAM:
CHEST - 2 VIEW

[chest pa]
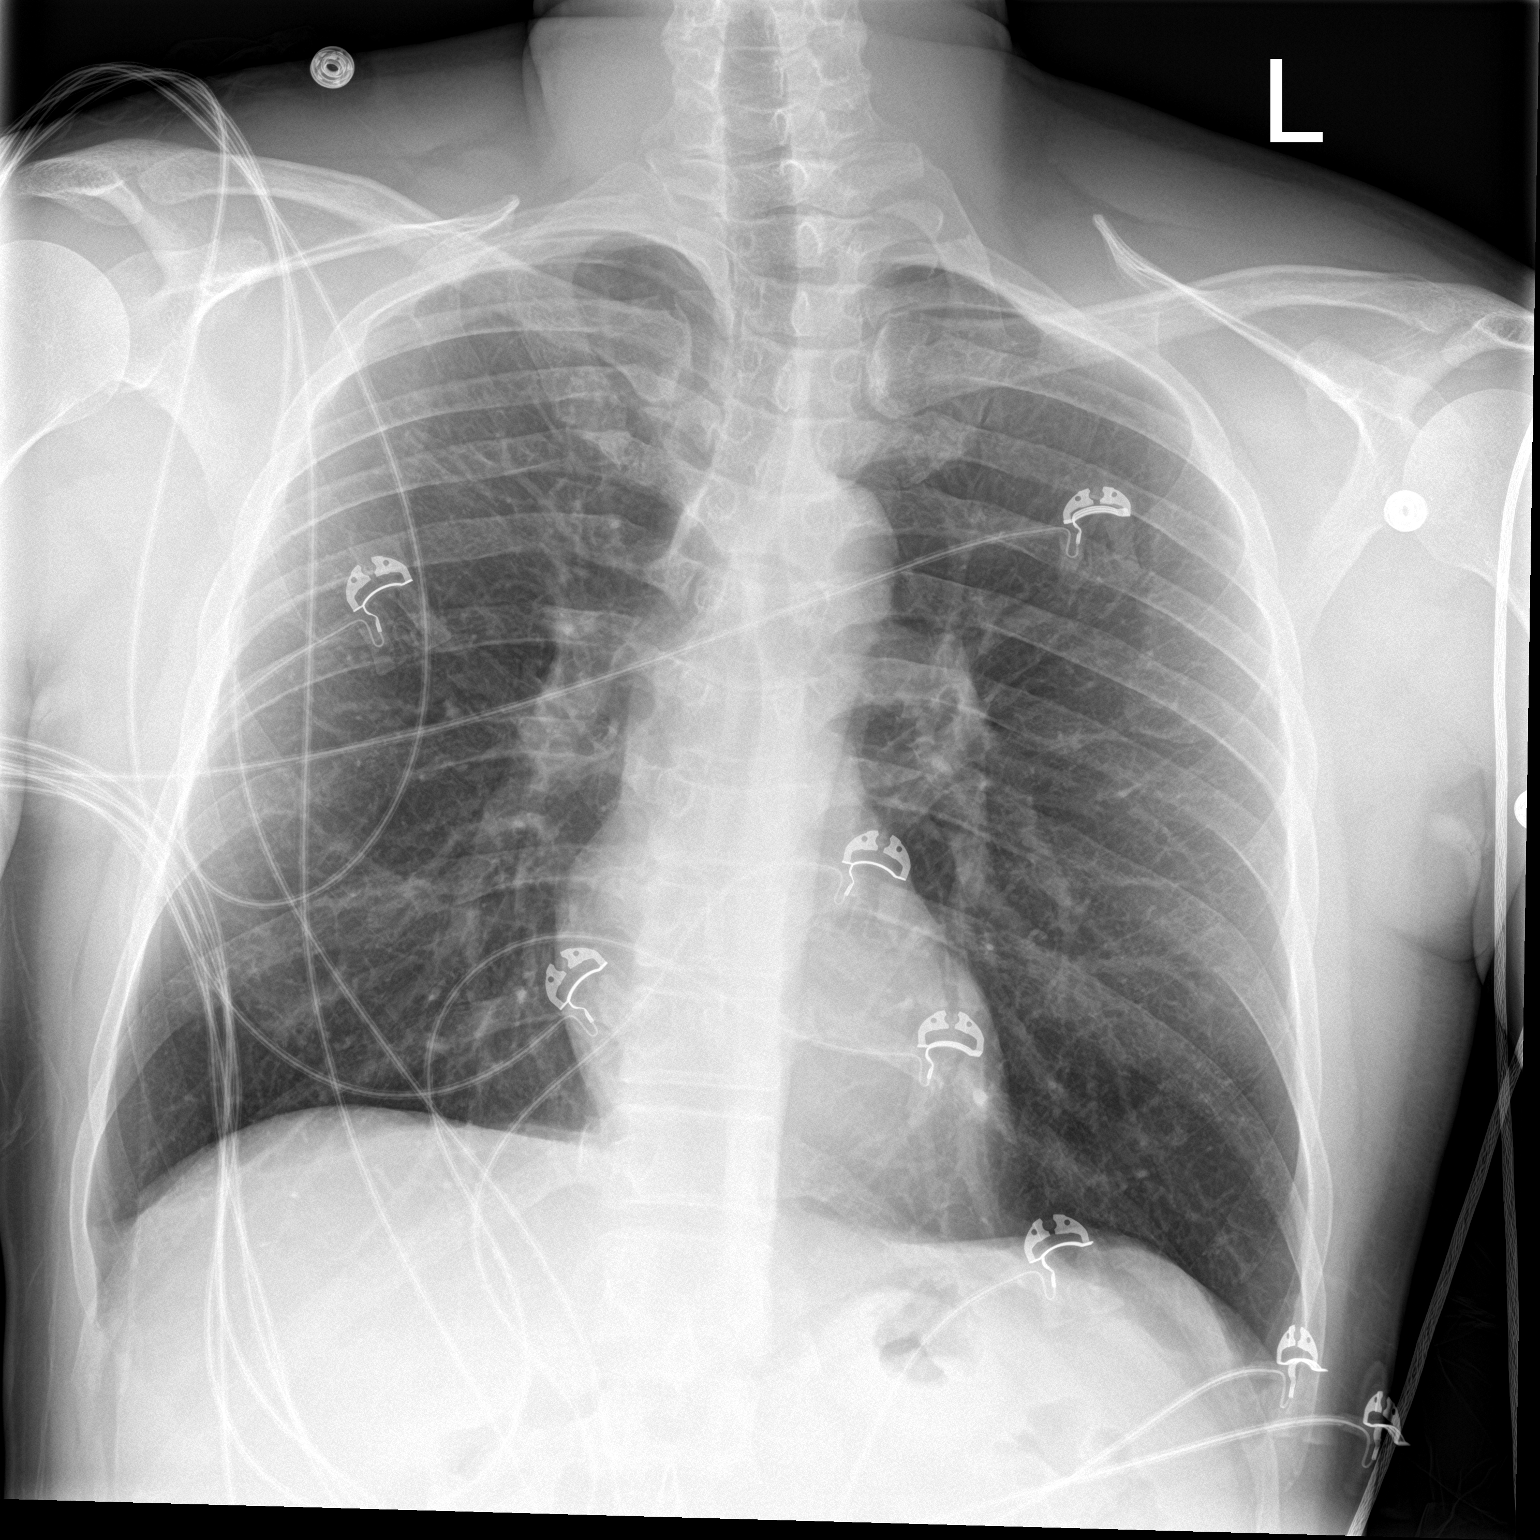

[chest lat]
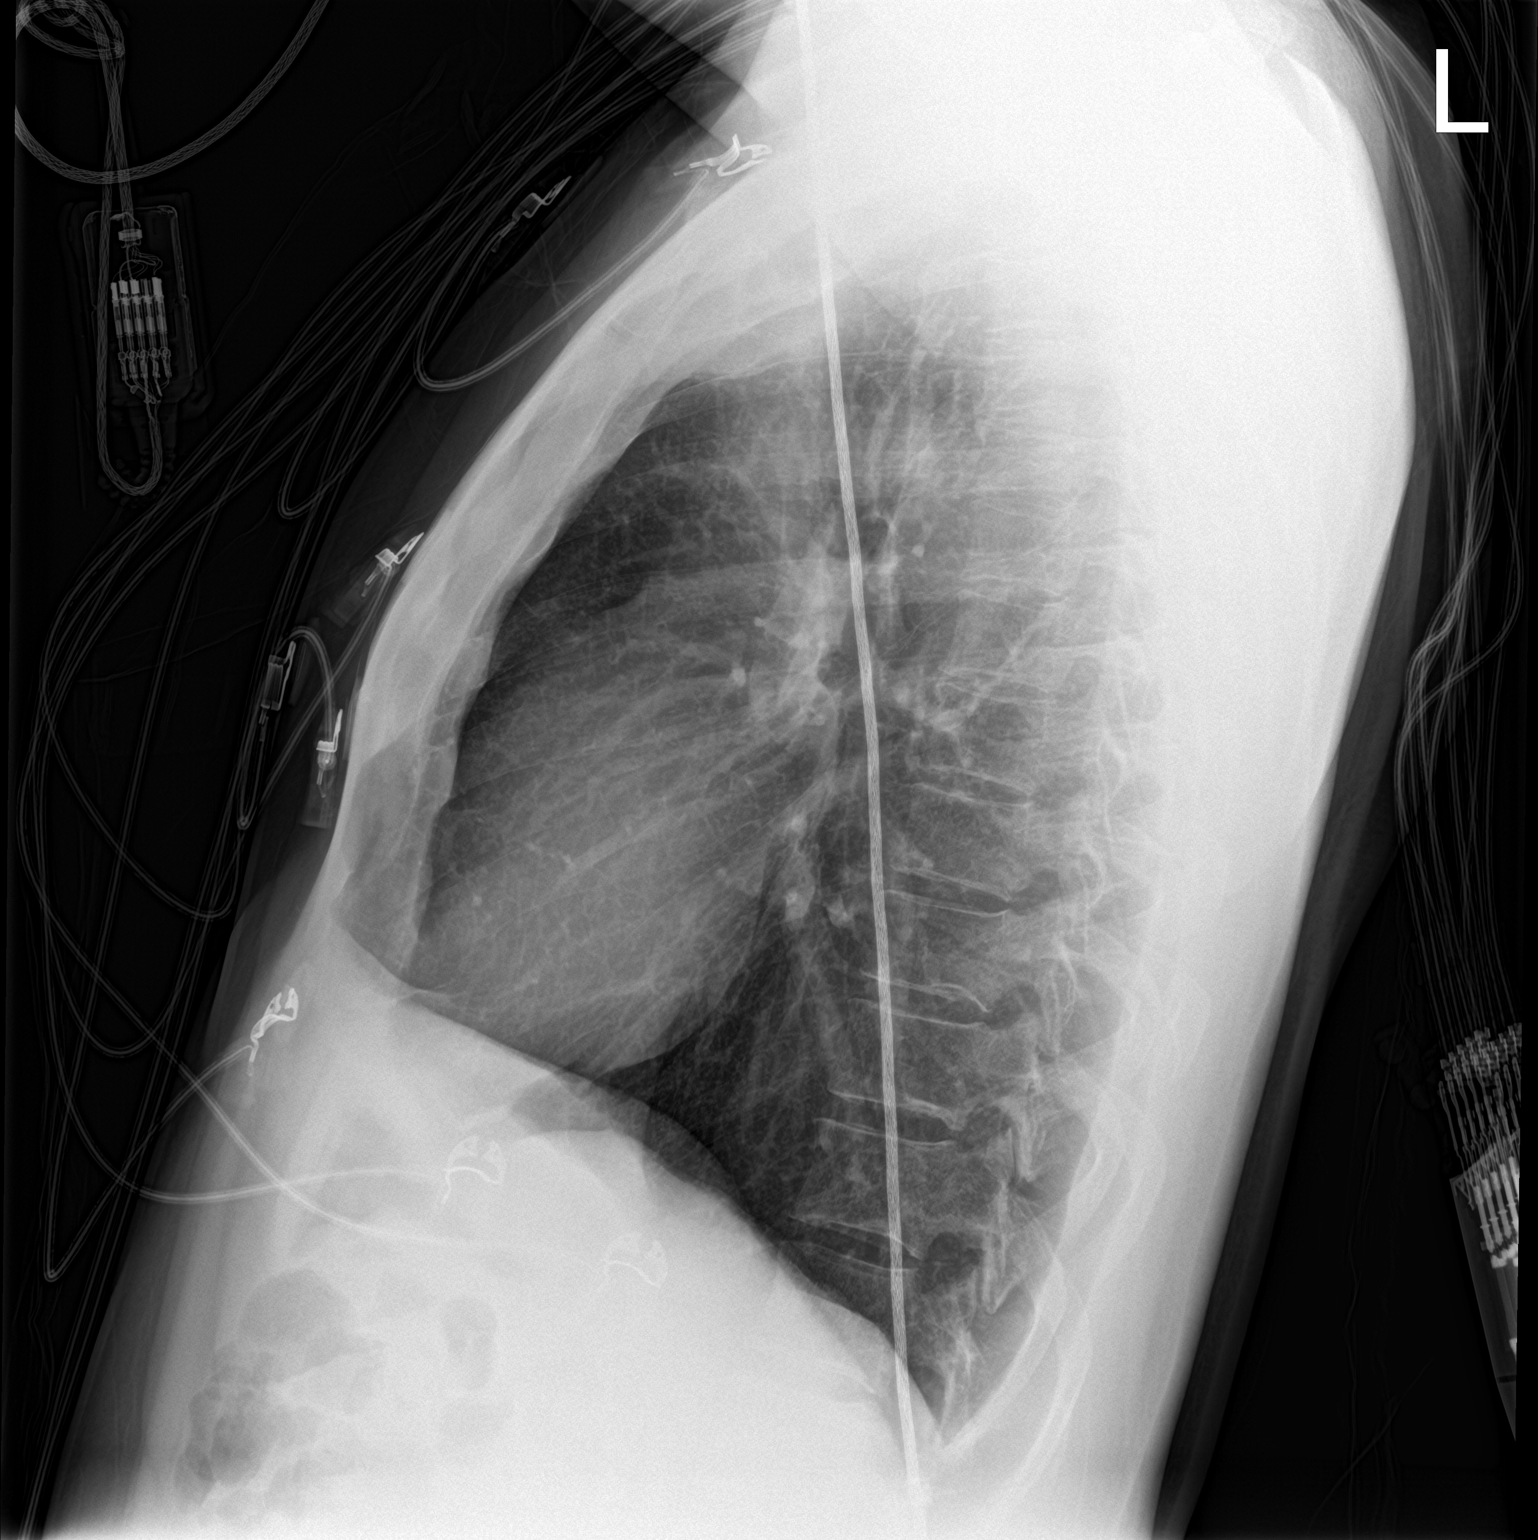

[2 of 2 positions shown; findings below may reference images not displayed]

FINDINGS: Lungs clear. Heart size normal. No pneumothorax or pleural fluid. No
acute or focal bony abnormality.
IMPRESSION: Negative chest.

## 2021-05-02 ENCOUNTER — Encounter (HOSPITAL_COMMUNITY): Payer: Self-pay

## 2021-05-02 ENCOUNTER — Ambulatory Visit (HOSPITAL_COMMUNITY)
Admission: EM | Admit: 2021-05-02 | Discharge: 2021-05-02 | Disposition: A | Discharge status: Home / Self Care | Payer: BLUE CROSS/BLUE SHIELD

## 2021-05-02 ENCOUNTER — Other Ambulatory Visit: Payer: Self-pay

## 2021-05-02 DIAGNOSIS — M79621 Pain in right upper arm: Secondary | ICD-10-CM

## 2021-05-02 DIAGNOSIS — M79622 Pain in left upper arm: Secondary | ICD-10-CM

## 2021-05-02 DIAGNOSIS — M79629 Pain in unspecified upper arm: Secondary | ICD-10-CM

## 2021-05-02 NOTE — ED Provider Notes (Addendum)
[name]    CSN: [id number] Arrival date & time: 05/02/21  1458      History   Chief Complaint Chief Complaint  Patient presents with   underarm pain    HPI Johnny Johnson is a 46 y.o. male.  Patient reports pain in his armpits that occurs maybe once a week, sometimes in 1 armpit and sometimes in the other, that lasts for approximately 3 seconds and then resolve spontaneously.  Typically happens when he is at work, when he is using some sort of press and moving his arms in extension and flexion  Pt saw PCP yesterday and had labs drawn, showed me his CBC to interpret.  He is concerned he may have an infection.  White blood cell count is normal.  Review of records in care everywhere shows that he has diabetes and an A1c of 12 as of 04/30/2021.  HPI  Past Medical History:  Diagnosis Date   Diabetes mellitus without complication [facility])    Hepatic hemangioma     Patient Active Problem List   Diagnosis Date Noted   DM (diabetes mellitus), type 2 ([place]) 06/07/2016    History reviewed. No pertinent surgical history.     Home Medications    Prior to Admission medications   Medication Sig Start Date End Date Taking? Authorizing Provider  atorvastatin (LIPITOR) 20 MG tablet Take 1 tablet (20 mg total) by mouth daily. 04/30/18   Charlott Rakes, MD  glipiZIDE (GLUCOTROL) 5 MG tablet Take 0.5 tablets (2.5 mg total) by mouth 2 (two) times daily before a meal. 04/30/18   Charlott Rakes, MD  omeprazole (PRILOSEC) 20 MG capsule Take 1 capsule (20 mg total) by mouth daily. 10/26/18   Quintella Reichert, MD  triamcinolone cream (KENALOG) 0.1 % Apply 1 application topically 2 (two) times daily. To rash on back 04/30/18   Charlott Rakes, MD    Family History History reviewed. No pertinent family history.  Social History Social History   Tobacco Use   Smoking status: Never   Smokeless tobacco: Never  Substance Use Topics   Alcohol use: Not Currently   Drug use:  Not Currently     Allergies   Patient has no known allergies.   Review of Systems Review of Systems  Constitutional:  Negative for chills and fever.  Skin:  Negative for rash.    Physical Exam Triage Vital Signs ED Triage Vitals  Enc Vitals Group     BP 05/02/21 1550 (!) 141/87     Pulse Rate 05/02/21 1550 93     Resp 05/02/21 1550 17     Temp 05/02/21 1550 98.5 F (36.9 C)     Temp Source 05/02/21 1550 Oral     SpO2 05/02/21 1550 100 %     Weight --      Height --      Head Circumference --      Peak Flow --      Pain Score 05/02/21 1548 10     Pain Loc --      Pain Edu? --      Excl. in [place]? --    No data found.  Updated Vital Signs BP (!) 141/87 (BP Location: Left Arm)    Pulse 93    Temp 98.5 F (36.9 C) (Oral)    Resp 17    SpO2 100%   Visual Acuity Right Eye Distance:   Left Eye Distance:   Bilateral Distance:    Right Eye  Near:   Left Eye Near:    Bilateral Near:     Physical Exam Constitutional:      Appearance: Normal appearance. He is not ill-appearing.  Pulmonary:     Effort: Pulmonary effort is normal.  Lymphadenopathy:     Upper Body:     Right upper body: No axillary adenopathy.     Left upper body: No axillary adenopathy.  Skin:    Comments: Skin of B axilla shows thick, velvety skin in areas, consistent with acanthosis nigricans.   Neurological:     Mental Status: He is alert.     UC Treatments / Results  Labs (all labs ordered are listed, but only abnormal results are displayed) Labs Reviewed - No data to display  EKG   Radiology No results found.  Procedures Procedures (including critical care time)  Medications Ordered in UC Medications - No data to display  Initial Impression / Assessment and Plan / UC Course  I have reviewed the triage vital signs and the nursing notes.  Pertinent labs & imaging results that were available during my care of the patient were reviewed by me and considered in my medical decision  making (see chart for details).    I reassured patient he has no signs of a skin infection in his armpits.  I am unsure what is causing his intermittent, brief bilateral armpit pain, but I suspect it is related to the movements he does for his work.  Final Clinical Impressions(s) / UC Diagnoses   Final diagnoses:  Pain in axilla, unspecified laterality     Discharge Instructions      You do not have an infection in your underarms. 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    ED Prescriptions   None    PDMP not reviewed this encounter.   Carvel Getting, NP 05/02/21 1616    Carvel Getting, NP 05/03/21 1146

## 2021-05-02 NOTE — Discharge Instructions (Addendum)
You do not have an infection in your underarms. 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

## 2021-05-02 NOTE — ED Triage Notes (Signed)
Pt presents with bilateral under arm pain X 6 months.

## 2023-01-22 ENCOUNTER — Ambulatory Visit (HOSPITAL_COMMUNITY)
Admission: EM | Admit: 2023-01-22 | Discharge: 2023-01-22 | Disposition: A | Discharge status: Home / Self Care | Payer: Medicaid Other | Attending: Physician Assistant | Admitting: Physician Assistant

## 2023-01-22 ENCOUNTER — Encounter (HOSPITAL_COMMUNITY): Payer: Self-pay

## 2023-01-22 DIAGNOSIS — R051 Acute cough: Secondary | ICD-10-CM | POA: Diagnosis present

## 2023-01-22 DIAGNOSIS — Z1152 Encounter for screening for COVID-19: Secondary | ICD-10-CM | POA: Insufficient documentation

## 2023-01-22 DIAGNOSIS — J069 Acute upper respiratory infection, unspecified: Secondary | ICD-10-CM | POA: Insufficient documentation

## 2023-01-22 DIAGNOSIS — R197 Diarrhea, unspecified: Secondary | ICD-10-CM | POA: Diagnosis present

## 2023-01-22 LAB — POCT INFLUENZA A/B
Influenza A, POC: NEGATIVE
Influenza B, POC: NEGATIVE

## 2023-01-22 MED ORDER — PROMETHAZINE-DM 6.25-15 MG/5ML PO SYRP: 5.0000 mL | ORAL_SOLUTION | Freq: Two times a day (BID) | ORAL | 0 refills | Status: DC | PRN

## 2023-01-22 MED ORDER — FLUTICASONE PROPIONATE 50 MCG/ACT NA SUSP: 1.0000 | Freq: Every day | NASAL | 0 refills | Status: AC

## 2023-01-22 NOTE — ED Triage Notes (Signed)
Patient here today with c/o cough, runny nose, fever, chills, sweats, body aches, nausea, vomiting, and diarrhea X 1 day. He has been taking Tylenol with some relief. His sons are also sick.

## 2023-01-22 NOTE — ED Provider Notes (Signed)
[facility]    CSN: [id number] Arrival date & time: 01/22/23  1410      History   Chief Complaint Chief Complaint  Patient presents with   Cough    HPI Shahiem Juniper Formella is a 47 y.o. male.   Patient presents today with a 2-day history of URI symptoms.  Reports cough, fever, chills, body aches, nausea, vomiting, diarrhea.  Denies any chest pain, shortness of breath, weakness.  He has tried Tylenol without improvement of symptoms.  Reports household sick contacts with similar symptoms including several of his children.  He has had COVID when COVID first emerged several years ago but has not had it more recently.  He has had COVID-19 vaccinations including most recent booster.  He has a history of diabetes and reports his blood sugars are adequately controlled.  Denies any history of asthma, allergies, COPD, smoking.  Denies any recent antibiotics or steroids.    Past Medical History:  Diagnosis Date   Diabetes mellitus without complication [facility])    Hepatic hemangioma     Patient Active Problem List   Diagnosis Date Noted   DM (diabetes mellitus), type 2 ([facility]) 06/07/2016    History reviewed. No pertinent surgical history.     Home Medications    Prior to Admission medications   Medication Sig Start Date End Date Taking? Authorizing Provider  fluticasone (FLONASE) 50 MCG/ACT nasal spray Place 1 spray into both nostrils daily. 01/22/23  Yes Chantrice Hagg K, PA-C  glucose blood (PRECISION QID TEST) test strip Check blood sugars as directed 05/01/21  Yes [provider]  losartan (COZAAR) 25 MG tablet Take 1 tablet by mouth daily. 05/16/22  Yes [provider]  metFORMIN (GLUCOPHAGE-XR) 500 MG 24 hr tablet Take 1 tablet by mouth daily with breakfast. 07/24/21  Yes [provider]  promethazine-dextromethorphan (PROMETHAZINE-DM) 6.25-15 MG/5ML syrup Take 5 mLs by mouth 2 (two) times daily as needed for cough. 01/22/23  Yes Jackquline Branca  K, PA-C  rosuvastatin (CRESTOR) 10 MG tablet Take 1 tablet by mouth daily. 05/16/22  Yes [provider]  BD PEN NEEDLE NANO 2ND GEN 32G X 4 MM MISC USE AS NEEDED FOR USE WITH TOUJEO    [provider]  glipiZIDE (GLUCOTROL) 5 MG tablet Take 0.5 tablets (2.5 mg total) by mouth 2 (two) times daily before a meal. 04/30/18   Newlin, Enobong, MD  LANTUS SOLOSTAR 100 UNIT/ML Solostar Pen Inject into the skin daily.    [provider]  omeprazole (PRILOSEC) 20 MG capsule Take 1 capsule (20 mg total) by mouth daily. 10/26/18   Tilden Fossa, MD  TOUJEO SOLOSTAR 300 UNIT/ML Solostar Pen Inject into the skin.    [provider]  triamcinolone cream (KENALOG) 0.1 % Apply 1 application topically 2 (two) times daily. To rash on back 04/30/18   Hoy Register, MD    Family History History reviewed. No pertinent family history.  Social History Social History   Tobacco Use   Smoking status: Never   Smokeless tobacco: Never  Vaping Use   Vaping status: Never Used  Substance Use Topics   Alcohol use: Not Currently   Drug use: Not Currently     Allergies   Patient has no known allergies.   Review of Systems Review of Systems  Constitutional:  Positive for activity change, chills and fever. Negative for appetite change and fatigue.  HENT:  Positive for congestion and rhinorrhea. Negative for sinus pressure, sneezing and sore throat.  Respiratory:  Positive for cough. Negative for shortness of breath.   Cardiovascular:  Negative for chest pain.  Gastrointestinal:  Positive for diarrhea, nausea and vomiting. Negative for abdominal pain.  Musculoskeletal:  Positive for arthralgias and myalgias.  Neurological:  Positive for headaches. Negative for dizziness and light-headedness.     Physical Exam Triage Vital Signs ED Triage Vitals  Encounter Vitals Group     BP 01/22/23 1440 128/84     Systolic BP Percentile --      Diastolic BP Percentile --      Pulse  Rate 01/22/23 1440 92     Resp 01/22/23 1440 16     Temp 01/22/23 1440 98.6 F (37 C)     Temp Source 01/22/23 1440 Oral     SpO2 01/22/23 1440 98 %     Weight 01/22/23 1440 178 lb 12.8 oz (81.1 kg)     Height 01/22/23 1440 6\' 1"  (1.854 m)     Head Circumference --      Peak Flow --      Pain Score 01/22/23 1443 0     Pain Loc --      Pain Education --      Exclude from Growth Chart --    No data found.  Updated Vital Signs BP 128/84 (BP Location: Right Arm)   Pulse 92   Temp 98.6 F (37 C) (Oral)   Resp 16   Ht 6\' 1"  (1.854 m)   Wt 178 lb 12.8 oz (81.1 kg)   SpO2 98%   BMI 23.59 kg/m   Visual Acuity Right Eye Distance:   Left Eye Distance:   Bilateral Distance:    Right Eye Near:   Left Eye Near:    Bilateral Near:     Physical Exam Vitals reviewed.  Constitutional:      General: He is awake.     Appearance: Normal appearance. He is well-developed. He is not ill-appearing.     Comments: Very pleasant male appears stated age in no acute distress sitting comfortably in exam room  HENT:     Head: Normocephalic and atraumatic.     Right Ear: Tympanic membrane, ear canal and external ear normal. Tympanic membrane is not erythematous or bulging.     Left Ear: Tympanic membrane, ear canal and external ear normal. Tympanic membrane is not erythematous or bulging.     Nose: Nose normal.     Mouth/Throat:     Pharynx: Uvula midline. No oropharyngeal exudate, posterior oropharyngeal erythema or uvula swelling.  Cardiovascular:     Rate and Rhythm: Normal rate and regular rhythm.     Heart sounds: Normal heart sounds, S1 normal and S2 normal. No murmur heard. Pulmonary:     Effort: Pulmonary effort is normal. No accessory muscle usage or respiratory distress.     Breath sounds: Normal breath sounds. No stridor. No wheezing, rhonchi or rales.     Comments: Clear to auscultation bilaterally Abdominal:     General: Bowel sounds are normal.     Palpations: Abdomen is  soft.     Tenderness: There is no abdominal tenderness.     Comments: Benign abdominal exam  Neurological:     Mental Status: He is alert.  Psychiatric:        Behavior: Behavior is cooperative.      UC Treatments / Results  Labs (all labs ordered are listed, but only abnormal results are displayed) Labs Reviewed  SARS CORONAVIRUS 2 (TAT 6-24 HRS)  POCT INFLUENZA A/B    EKG   Radiology No results found.  Procedures Procedures (including critical care time)  Medications Ordered in UC Medications - No data to display  Initial Impression / Assessment and Plan / UC Course  I have reviewed the triage vital signs and the nursing notes.  Pertinent labs & imaging results that were available during my care of the patient were reviewed by me and considered in my medical decision making (see chart for details).     Patient is well-appearing, afebrile, nontoxic, nontachycardic.  No evidence of acute infection on physical exam though would warrant initiation of antibiotics.  Concern for viral etiology.  Flu testing was negative.  COVID testing was obtained and is pending.  Given history of diabetes patient is a candidate for antiviral therapy.  His last metabolic panel obtained 10/08/2022 showed normal kidney function with a creatinine of 0.96 and EGFR of greater than 90 mL/min.  He would need to hold his rosuvastatin while on this medication for 3 days after completing course.  In the meantime we will treat symptomatically.  He was encouraged use over-the-counter medications including Mucinex, Flonase, Tylenol as well as nasal saline and sinus rinses.  Fluticasone nasal spray was sent to pharmacy as well as Promethazine DM.  We discussed that this can be sedating and he is not to drive or drink alcohol taking it.  If his symptoms are not improving within a week he is to return for reevaluation.  Discussed that if he has any worsening or changing symptoms he needs to be seen immediately  including chest pain, shortness of breath, high fever, nausea/vomiting interfering with oral intake, weakness.  Strict return precautions given.  Work excuse note was provided.  Patient is candidate for antiviral therapy: Yes Medication recommendation: nirmatrelvir ritonavir eGFR: >90 Renal Dosing: No Medication adjustment while on antiviral therapy: Hold rosuvastatin while on this medication and for 3 days after completing course.   Final Clinical Impressions(s) / UC Diagnoses   Final diagnoses:  Upper respiratory tract infection, unspecified type  Acute cough     Discharge Instructions      You tested negative for flu.  We will contact you if you are positive for COVID to start medication.  If you are positive for COVID you are candidate for answer therapy including Paxlovid but would need to hold your rosuvastatin while on this medication for 3 days after completing course.  In the meantime, use over-the-counter medications to help with your symptoms including Mucinex and Tylenol.  Take fluticasone nasal spray for congestion as well as use nasal saline/sinus rinses.  Take Promethazine DM for cough.  This will make you sleepy so do not drive or drink alcohol while taking it.  If your symptoms are not improving within a few days or if anything worsens and you have worsening cough, fever, chest pain, shortness of breath, nausea/vomiting interfering with oral intake you need to be seen immediately.     ED Prescriptions     Medication Sig Dispense Auth. Provider   promethazine-dextromethorphan (PROMETHAZINE-DM) 6.25-15 MG/5ML syrup Take 5 mLs by mouth 2 (two) times daily as needed for cough. 118 mL Chevis Weisensel K, PA-C   fluticasone (FLONASE) 50 MCG/ACT nasal spray Place 1 spray into both nostrils daily. 16 g Baron Parmelee K, PA-C      PDMP not reviewed this encounter.   Jeani Hawking, PA-C 01/22/23 1554

## 2023-01-22 NOTE — Discharge Instructions (Addendum)
You tested negative for flu.  We will contact you if you are positive for COVID to start medication.  If you are positive for COVID you are candidate for answer therapy including Paxlovid but would need to hold your rosuvastatin while on this medication for 3 days after completing course.  In the meantime, use over-the-counter medications to help with your symptoms including Mucinex and Tylenol.  Take fluticasone nasal spray for congestion as well as use nasal saline/sinus rinses.  Take Promethazine DM for cough.  This will make you sleepy so do not drive or drink alcohol while taking it.  If your symptoms are not improving within a few days or if anything worsens and you have worsening cough, fever, chest pain, shortness of breath, nausea/vomiting interfering with oral intake you need to be seen immediately.

## 2023-01-23 LAB — SARS CORONAVIRUS 2 (TAT 6-24 HRS): SARS Coronavirus 2: NEGATIVE

## 2023-08-05 ENCOUNTER — Ambulatory Visit (HOSPITAL_COMMUNITY): Admission: EM | Admit: 2023-08-05 | Discharge: 2023-08-05 | Disposition: A | Discharge status: Home / Self Care

## 2023-08-05 ENCOUNTER — Encounter (HOSPITAL_COMMUNITY): Payer: Self-pay

## 2023-08-05 ENCOUNTER — Ambulatory Visit (INDEPENDENT_AMBULATORY_CARE_PROVIDER_SITE_OTHER)

## 2023-08-05 DIAGNOSIS — M545 Low back pain, unspecified: Secondary | ICD-10-CM

## 2023-08-05 DIAGNOSIS — S39012A Strain of muscle, fascia and tendon of lower back, initial encounter: Secondary | ICD-10-CM

## 2023-08-05 LAB — POCT URINALYSIS DIP (MANUAL ENTRY)
Bilirubin, UA: NEGATIVE
Blood, UA: NEGATIVE
Glucose, UA: 500 mg/dL — AB
Leukocytes, UA: NEGATIVE
Nitrite, UA: NEGATIVE
Protein Ur, POC: NEGATIVE mg/dL
Spec Grav, UA: 1.02 (ref 1.010–1.025)
Urobilinogen, UA: 0.2 U/dL
pH, UA: 6 (ref 5.0–8.0)

## 2023-08-05 LAB — POCT FASTING CBG KUC MANUAL ENTRY: POCT Glucose (KUC): 320 mg/dL — AB (ref 70–99)

## 2023-08-05 MED ORDER — DICLOFENAC SODIUM 75 MG PO TBEC: 75.0000 mg | DELAYED_RELEASE_TABLET | Freq: Two times a day (BID) | ORAL | 0 refills | Status: AC

## 2023-08-05 NOTE — Discharge Instructions (Addendum)
  1. 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  Consistent with current symptoms.  No lumbar fracture. - POC CBG monitoring performed in UC is 320 mg/dL - diclofenac (VOLTAREN) 75 MG EC tablet; Take 1 tablet (75 mg total) by mouth 2 (two) times daily.  Dispense: 30 tablet; Refill: 0 - AMB referral to sports medicine for follow-up evaluation and management of lumbosacral strain. -Continue to monitor symptoms for any change in severity if there is any escalation of current symptoms or development of new symptoms follow-up in ER for further evaluation and management.

## 2023-08-05 NOTE — ED Provider Notes (Signed)
 [facility]  Note:  This document was prepared using Dragon voice recognition software and may include unintentional dictation errors.  MRN: [id number] DOB: June 09, 1975  Subjective:   Johnny Johnson is a 48 y.o. male presenting for midline lower back pain since yesterday.  Patient denies any known injury or trauma.  Patient denies any radiating to lower extremities.  Patient has not taken any over-the-counter medication or treatment for symptoms.  Patient reports he remembers having back pain previously but not for several years.  Denies any known exacerbating circumstance.  No current facility-administered medications for this encounter.  Current Outpatient Medications:    diclofenac (VOLTAREN) 75 MG EC tablet, Take 1 tablet (75 mg total) by mouth 2 (two) times daily., Disp: 30 tablet, Rfl: 0   BD PEN NEEDLE NANO 2ND GEN 32G X 4 MM MISC, USE AS NEEDED FOR USE WITH TOUJEO, Disp: , Rfl:    fluticasone  (FLONASE ) 50 MCG/ACT nasal spray, Place 1 spray into both nostrils daily., Disp: 16 g, Rfl: 0   glipiZIDE  (GLUCOTROL ) 5 MG tablet, Take 0.5 tablets (2.5 mg total) by mouth 2 (two) times daily before a meal., Disp: 60 tablet, Rfl: 6   glucose blood (PRECISION QID TEST) test strip, Check blood sugars as directed, Disp: , Rfl:    LANTUS SOLOSTAR 100 UNIT/ML Solostar Pen, Inject into the skin daily., Disp: , Rfl:    losartan (COZAAR) 25 MG tablet, Take 1 tablet by mouth daily., Disp: , Rfl:    metFORMIN  (GLUCOPHAGE -XR) 500 MG 24 hr tablet, Take 1 tablet by mouth daily with breakfast., Disp: , Rfl:    omeprazole  (PRILOSEC) 20 MG capsule, Take 1 capsule (20 mg total) by mouth daily., Disp: 30 capsule, Rfl: 0   rosuvastatin (CRESTOR) 10 MG tablet, Take 1 tablet by mouth daily., Disp: , Rfl:    TOUJEO SOLOSTAR 300 UNIT/ML Solostar Pen, Inject into the skin., Disp: , Rfl:    triamcinolone  cream (KENALOG ) 0.1 %, Apply 1 application topically 2 (two) times daily. To rash on back,  Disp: 45 g, Rfl: 1   No Known Allergies  Past Medical History:  Diagnosis Date   Diabetes mellitus without complication ([facility])    Hepatic hemangioma      History reviewed. No pertinent surgical history.  History reviewed. No pertinent family history.  Social History   Tobacco Use   Smoking status: Never   Smokeless tobacco: Never  Vaping Use   Vaping status: Never Used  Substance Use Topics   Alcohol use: Not Currently   Drug use: Not Currently    ROS Refer to HPI for ROS details.  Objective:   Vitals: BP (!) 145/75 (BP Location: Right Arm)   Pulse 92   Temp 98.6 F (37 C) (Oral)   Resp 18   SpO2 98%   Physical Exam Vitals and nursing note reviewed.  Constitutional:      General: He is not in acute distress.    Appearance: Normal appearance. He is not ill-appearing or toxic-appearing.  HENT:     Head: Normocephalic.  Cardiovascular:     Rate and Rhythm: Normal rate.  Pulmonary:     Effort: Pulmonary effort is normal. No respiratory distress.  Abdominal:     Palpations: Abdomen is soft.     Tenderness: There is abdominal tenderness (Bilateral suprapubic abdominal pressure with palpation, no dysuria or increased urinary frequency). There is no right CVA tenderness or left CVA tenderness.  Musculoskeletal:     Lumbar back: Tenderness  and bony tenderness present. No swelling, deformity, signs of trauma or spasms. Normal range of motion.  Skin:    General: Skin is warm and dry.     Capillary Refill: Capillary refill takes less than 2 seconds.  Neurological:     General: No focal deficit present.     Mental Status: He is alert and oriented to person, place, and time.  Psychiatric:        Mood and Affect: Mood normal.        Behavior: Behavior normal.     Procedures  Results for orders placed or performed during the hospital encounter of 08/05/23 (from the past 24 hours)  POC urinalysis dipstick     Status: Abnormal   Collection Time: 08/05/23 11:04 AM   Result Value Ref Range   Color, UA yellow yellow   Clarity, UA clear clear   Glucose, UA =500 (A) negative mg/dL   Bilirubin, UA negative negative   Ketones, POC UA small (15) (A) negative mg/dL   Spec Grav, UA 4.098 1.191 - 1.025   Blood, UA negative negative   pH, UA 6.0 5.0 - 8.0   Protein Ur, POC negative negative mg/dL   Urobilinogen, UA 0.2 0.2 or 1.0 E.U./dL   Nitrite, UA Negative Negative   Leukocytes, UA Negative Negative  POC CBG monitoring     Status: Abnormal   Collection Time: 08/05/23 11:24 AM  Result Value Ref Range   POCT Glucose ([facility]) 320 (A) 70 - 99 mg/dL    DG Lumbar Spine Complete Result Date: 08/05/2023 CLINICAL DATA:  One day history of lower back pain. No known injury. EXAM: LUMBAR SPINE - COMPLETE 5 VIEW COMPARISON:  Chest radiograph dated 10/26/2018 FINDINGS: There is no evidence of lumbar spine fracture. Alignment is normal. Intervertebral disc spaces are preserved. 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 IMPRESSION: 1. No acute fracture or traumatic malalignment. 2. 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 Electronically Signed   By: Limin  Xu M.D.   On: 08/05/2023 11:52     Assessment and Plan :     Discharge Instructions       1. 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  Consistent with current symptoms.  No lumbar fracture. - POC CBG monitoring performed in UC is 320 mg/dL - diclofenac (VOLTAREN) 75 MG EC tablet; Take 1 tablet (75 mg total) by mouth 2 (two) times daily.  Dispense: 30 tablet; Refill: 0 - AMB referral to sports medicine for follow-up evaluation and management of lumbosacral strain. -Continue to monitor symptoms for  any change in severity if there is any escalation of current symptoms or development of new symptoms follow-up in ER for further evaluation and management.      Suprena Travaglini B Diya Gervasi   Meeya Goldin, [place], [place] 08/05/23 1201

## 2023-08-05 NOTE — ED Triage Notes (Signed)
 Pt c/o lower back pain since yesterday. Denies pain radiating or known injury. Denies taking any meds for sx's.

## 2023-08-19 ENCOUNTER — Ambulatory Visit: Admitting: Sports Medicine

## 2024-02-02 ENCOUNTER — Encounter: Payer: Self-pay | Admitting: Radiology
